# Patient Record
Sex: Male | Born: 1940 | Race: White | Hispanic: No | Marital: Married | Smoking: Former smoker
Health system: Southern US, Community
[De-identification: ages and names within clinical notes are randomized; demographics above are authoritative.]

## PROBLEM LIST (undated history)

## (undated) DIAGNOSIS — K219 Gastro-esophageal reflux disease without esophagitis: Secondary | ICD-10-CM

## (undated) DIAGNOSIS — J45909 Unspecified asthma, uncomplicated: Secondary | ICD-10-CM

## (undated) DIAGNOSIS — J449 Chronic obstructive pulmonary disease, unspecified: Secondary | ICD-10-CM

## (undated) DIAGNOSIS — J40 Bronchitis, not specified as acute or chronic: Secondary | ICD-10-CM

## (undated) DIAGNOSIS — M1990 Unspecified osteoarthritis, unspecified site: Secondary | ICD-10-CM

## (undated) DIAGNOSIS — R413 Other amnesia: Secondary | ICD-10-CM

## (undated) DIAGNOSIS — R0602 Shortness of breath: Secondary | ICD-10-CM

## (undated) DIAGNOSIS — H5440 Blindness, one eye, unspecified eye: Secondary | ICD-10-CM

## (undated) DIAGNOSIS — F32A Depression, unspecified: Secondary | ICD-10-CM

## (undated) DIAGNOSIS — F329 Major depressive disorder, single episode, unspecified: Secondary | ICD-10-CM

## (undated) DIAGNOSIS — M1711 Unilateral primary osteoarthritis, right knee: Secondary | ICD-10-CM

## (undated) DIAGNOSIS — F0390 Unspecified dementia without behavioral disturbance: Secondary | ICD-10-CM

## (undated) DIAGNOSIS — I10 Essential (primary) hypertension: Secondary | ICD-10-CM

## (undated) HISTORY — PX: EYE SURGERY: SHX253

## (undated) HISTORY — DX: Shortness of breath: R06.02

## (undated) HISTORY — DX: Gastro-esophageal reflux disease without esophagitis: K21.9

## (undated) HISTORY — DX: Unspecified osteoarthritis, unspecified site: M19.90

## (undated) HISTORY — DX: Other amnesia: R41.3

## (undated) HISTORY — DX: Chronic obstructive pulmonary disease, unspecified: J44.9

---
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
## 1978-01-19 HISTORY — PX: CHOLECYSTECTOMY: SHX55

## 2000-06-18 ENCOUNTER — Encounter: Payer: Self-pay | Admitting: Family Medicine

## 2000-06-18 ENCOUNTER — Ambulatory Visit (HOSPITAL_COMMUNITY): Admission: RE | Admit: 2000-06-18 | Discharge: 2000-06-18 | Payer: Self-pay | Admitting: Family Medicine

## 2000-12-17 ENCOUNTER — Encounter: Payer: Self-pay | Admitting: Family Medicine

## 2000-12-17 ENCOUNTER — Ambulatory Visit (HOSPITAL_COMMUNITY): Admission: RE | Admit: 2000-12-17 | Discharge: 2000-12-17 | Payer: Self-pay | Admitting: Family Medicine

## 2002-07-24 ENCOUNTER — Ambulatory Visit (HOSPITAL_COMMUNITY): Admission: RE | Admit: 2002-07-24 | Discharge: 2002-07-24 | Payer: Self-pay | Admitting: Family Medicine

## 2002-07-24 ENCOUNTER — Encounter: Payer: Self-pay | Admitting: Family Medicine

## 2004-09-26 ENCOUNTER — Ambulatory Visit: Payer: Self-pay | Admitting: Internal Medicine

## 2004-09-26 ENCOUNTER — Encounter (INDEPENDENT_AMBULATORY_CARE_PROVIDER_SITE_OTHER): Payer: Self-pay | Admitting: Internal Medicine

## 2004-09-26 ENCOUNTER — Ambulatory Visit (HOSPITAL_COMMUNITY): Admission: RE | Admit: 2004-09-26 | Discharge: 2004-09-26 | Payer: Self-pay | Admitting: Internal Medicine

## 2005-10-13 ENCOUNTER — Ambulatory Visit (HOSPITAL_COMMUNITY): Admission: RE | Admit: 2005-10-13 | Discharge: 2005-10-13 | Payer: Self-pay | Admitting: Family Medicine

## 2006-06-10 ENCOUNTER — Ambulatory Visit: Payer: Self-pay | Admitting: *Deleted

## 2007-02-16 ENCOUNTER — Ambulatory Visit (HOSPITAL_COMMUNITY): Admission: RE | Admit: 2007-02-16 | Discharge: 2007-02-16 | Payer: Self-pay | Admitting: Family Medicine

## 2009-10-14 ENCOUNTER — Encounter (INDEPENDENT_AMBULATORY_CARE_PROVIDER_SITE_OTHER): Payer: Self-pay | Admitting: *Deleted

## 2009-11-06 ENCOUNTER — Ambulatory Visit (HOSPITAL_COMMUNITY)
Admission: RE | Admit: 2009-11-06 | Discharge: 2009-11-06 | Payer: Self-pay | Source: Home / Self Care | Admitting: Internal Medicine

## 2009-11-06 ENCOUNTER — Ambulatory Visit: Payer: Self-pay | Admitting: Internal Medicine

## 2010-02-18 NOTE — Letter (Signed)
Summary: Recall, Screening Colonoscopy Only  [facility]  [address]   [place], [place] [address]   Phone: [phone]  Fax: [phone]    October 14, 2009  Willie Turner [address] [place], [place]  [address] 09-Jan-1941   Dear [name],   Our records indicate it is time to schedule your colonoscopy.    Please call our office at [phone] and ask for the nurse.   Thank you,    Hendricks Limes, LPN Cloria Spring, LPN  [facility] Ph: [phone]   Fax: [phone]

## 2010-06-06 NOTE — Op Note (Signed)
NAME:  Willie Turner, Willie Turner                ACCOUNT NO.:  [id number]   MEDICAL RECORD NO.:  [id number]          PATIENT TYPE:  AMB   LOCATION:  DAY                           FACILITY:  [facility]   PHYSICIAN:  Lionel December, M.D.    DATE OF BIRTH:  1940/09/30   DATE OF PROCEDURE:  09/26/2004  DATE OF DISCHARGE:                                 OPERATIVE REPORT   PROCEDURE:  Colonoscopy with polypectomy.   INDICATIONS:  [name] is 70 year old Caucasian male who is here for screening  colonoscopy. While family history is negative for colon carcinoma, his  brother and a sister have had polyps removed. Procedure risks were reviewed  with the patient, and informed consent was obtained.   PREMEDICATION:  Demerol 25 mg IV, Versed 4 mg IV in divided dose.   FINDINGS:  Procedure performed in endoscopy suite. The patient's vital signs  and O2 saturation were monitored during the procedure and remained stable.  The patient was placed in left lateral position and rectal examination  performed. No abnormality noted on external or digital exam. Olympus  videoscope was placed in rectum and advanced under vision into sigmoid colon  and beyond. Preparation was excellent. Scope was passed to cecum which was  identified by appendiceal orifice and ileocecal valve. Pictures taken for  the record. As the scope was withdrawn, colonic mucosa was carefully  examined. There was a 5-6 mm sessile polyp at proximal transverse colon  which was snared and retrieved for histologic examination. A second polyp  was located in the area of splenic flexure. It was pedunculated about 12 x 8  mm. This was also snared and retrieved for histologic examination. Mucosa of  the rest of the colon was normal. Rectal mucosa similarly was normal. Scope  was retroflexed to examine anorectal junction which was unremarkable.  Endoscope was straightened and withdrawn. The patient tolerated the  procedure well.   FINAL DIAGNOSIS:  1.  A 5-6 mm  sessile polyp snared from proximal transverse colon.  2.  A 12 x 8 mm pedunculated polyp snared from splenic flexure.  3.  The rest of the exam was normal.   RECOMMENDATIONS:  Standard instructions given. I will be contacting the  patient with biopsy results and further recommendations.      Lionel December, M.D.  Electronically Signed     NR/MEDQ  D:  09/26/2004  T:  09/26/2004  Job:  [id number]   cc:   Patrica Duel, M.D.  [address], [address]  [place]  [place] [address]  Fax: [phone]

## 2011-03-10 ENCOUNTER — Other Ambulatory Visit (HOSPITAL_COMMUNITY): Payer: Self-pay | Admitting: Family Medicine

## 2011-03-10 ENCOUNTER — Ambulatory Visit (HOSPITAL_COMMUNITY)
Admission: RE | Admit: 2011-03-10 | Discharge: 2011-03-10 | Disposition: A | Discharge status: Home / Self Care | Payer: Medicare Other | Source: Ambulatory Visit | Attending: Family Medicine | Admitting: Family Medicine

## 2011-03-10 DIAGNOSIS — Z6827 Body mass index (BMI) 27.0-27.9, adult: Secondary | ICD-10-CM | POA: Diagnosis not present

## 2011-03-10 DIAGNOSIS — J209 Acute bronchitis, unspecified: Secondary | ICD-10-CM | POA: Insufficient documentation

## 2011-03-10 DIAGNOSIS — J019 Acute sinusitis, unspecified: Secondary | ICD-10-CM | POA: Diagnosis not present

## 2011-07-11 DIAGNOSIS — Z6827 Body mass index (BMI) 27.0-27.9, adult: Secondary | ICD-10-CM | POA: Diagnosis not present

## 2011-07-11 DIAGNOSIS — L259 Unspecified contact dermatitis, unspecified cause: Secondary | ICD-10-CM | POA: Diagnosis not present

## 2011-07-11 DIAGNOSIS — I10 Essential (primary) hypertension: Secondary | ICD-10-CM | POA: Diagnosis not present

## 2011-07-22 DIAGNOSIS — I10 Essential (primary) hypertension: Secondary | ICD-10-CM | POA: Diagnosis not present

## 2011-07-22 DIAGNOSIS — J45909 Unspecified asthma, uncomplicated: Secondary | ICD-10-CM | POA: Diagnosis not present

## 2011-07-22 DIAGNOSIS — L259 Unspecified contact dermatitis, unspecified cause: Secondary | ICD-10-CM | POA: Diagnosis not present

## 2011-09-16 DIAGNOSIS — Z23 Encounter for immunization: Secondary | ICD-10-CM | POA: Diagnosis not present

## 2011-12-03 DIAGNOSIS — J45909 Unspecified asthma, uncomplicated: Secondary | ICD-10-CM | POA: Diagnosis not present

## 2011-12-03 DIAGNOSIS — R7301 Impaired fasting glucose: Secondary | ICD-10-CM | POA: Diagnosis not present

## 2011-12-03 DIAGNOSIS — R5381 Other malaise: Secondary | ICD-10-CM | POA: Diagnosis not present

## 2011-12-03 DIAGNOSIS — R5383 Other fatigue: Secondary | ICD-10-CM | POA: Diagnosis not present

## 2011-12-03 DIAGNOSIS — G4730 Sleep apnea, unspecified: Secondary | ICD-10-CM | POA: Diagnosis not present

## 2011-12-03 DIAGNOSIS — E785 Hyperlipidemia, unspecified: Secondary | ICD-10-CM | POA: Diagnosis not present

## 2011-12-03 DIAGNOSIS — I10 Essential (primary) hypertension: Secondary | ICD-10-CM | POA: Diagnosis not present

## 2012-01-07 DIAGNOSIS — M1710 Unilateral primary osteoarthritis, unspecified knee: Secondary | ICD-10-CM | POA: Diagnosis not present

## 2012-01-07 DIAGNOSIS — M25559 Pain in unspecified hip: Secondary | ICD-10-CM | POA: Diagnosis not present

## 2012-02-18 DIAGNOSIS — M1710 Unilateral primary osteoarthritis, unspecified knee: Secondary | ICD-10-CM | POA: Diagnosis not present

## 2012-05-24 DIAGNOSIS — M1710 Unilateral primary osteoarthritis, unspecified knee: Secondary | ICD-10-CM | POA: Diagnosis not present

## 2012-05-31 DIAGNOSIS — M1710 Unilateral primary osteoarthritis, unspecified knee: Secondary | ICD-10-CM | POA: Diagnosis not present

## 2012-06-07 DIAGNOSIS — M1710 Unilateral primary osteoarthritis, unspecified knee: Secondary | ICD-10-CM | POA: Diagnosis not present

## 2012-07-20 DIAGNOSIS — Z6828 Body mass index (BMI) 28.0-28.9, adult: Secondary | ICD-10-CM | POA: Diagnosis not present

## 2012-07-20 DIAGNOSIS — J45909 Unspecified asthma, uncomplicated: Secondary | ICD-10-CM | POA: Diagnosis not present

## 2012-07-20 DIAGNOSIS — E785 Hyperlipidemia, unspecified: Secondary | ICD-10-CM | POA: Diagnosis not present

## 2012-07-20 DIAGNOSIS — I10 Essential (primary) hypertension: Secondary | ICD-10-CM | POA: Diagnosis not present

## 2012-08-24 ENCOUNTER — Encounter (HOSPITAL_COMMUNITY): Payer: Self-pay | Admitting: Pharmacy Technician

## 2012-08-24 ENCOUNTER — Other Ambulatory Visit: Payer: Self-pay | Admitting: Physician Assistant

## 2012-08-24 ENCOUNTER — Encounter: Payer: Self-pay | Admitting: Physician Assistant

## 2012-08-24 DIAGNOSIS — M1710 Unilateral primary osteoarthritis, unspecified knee: Secondary | ICD-10-CM | POA: Diagnosis not present
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
## 2012-08-29 ENCOUNTER — Encounter (HOSPITAL_COMMUNITY): Payer: Self-pay

## 2012-08-29 ENCOUNTER — Encounter (HOSPITAL_COMMUNITY)
Admission: RE | Admit: 2012-08-29 | Discharge: 2012-08-29 | Disposition: A | Discharge status: Home / Self Care | Payer: Medicare Other | Source: Ambulatory Visit | Attending: Physician Assistant | Admitting: Physician Assistant

## 2012-08-29 ENCOUNTER — Encounter (HOSPITAL_COMMUNITY)
Admission: RE | Admit: 2012-08-29 | Discharge: 2012-08-29 | Disposition: A | Discharge status: Home / Self Care | Payer: Medicare Other | Source: Ambulatory Visit | Attending: Orthopedic Surgery | Admitting: Orthopedic Surgery

## 2012-08-29 DIAGNOSIS — Z01812 Encounter for preprocedural laboratory examination: Secondary | ICD-10-CM | POA: Diagnosis not present

## 2012-08-29 DIAGNOSIS — Z01818 Encounter for other preprocedural examination: Secondary | ICD-10-CM | POA: Insufficient documentation

## 2012-08-29 DIAGNOSIS — Z01810 Encounter for preprocedural cardiovascular examination: Secondary | ICD-10-CM | POA: Diagnosis not present

## 2012-08-29 HISTORY — DX: Bronchitis, not specified as acute or chronic: J40

## 2012-08-29 HISTORY — DX: Unspecified asthma, uncomplicated: J45.909

## 2012-08-29 HISTORY — DX: Essential (primary) hypertension: I10

## 2012-08-29 LAB — CBC WITH DIFFERENTIAL/PLATELET
Basophils Relative: 1 % (ref 0–1)
Eosinophils Absolute: 0.2 10*3/uL (ref 0.0–0.7)
Eosinophils Relative: 2 % (ref 0–5)
HCT: 50.9 % (ref 39.0–52.0)
Hemoglobin: 17.4 g/dL — ABNORMAL HIGH (ref 13.0–17.0)
MCH: 30.2 pg (ref 26.0–34.0)
MCHC: 34.2 g/dL (ref 30.0–36.0)
Monocytes Absolute: 0.7 10*3/uL (ref 0.1–1.0)
Monocytes Relative: 9 % (ref 3–12)

## 2012-08-29 LAB — COMPREHENSIVE METABOLIC PANEL
Albumin: 4.3 g/dL (ref 3.5–5.2)
BUN: 24 mg/dL — ABNORMAL HIGH (ref 6–23)
Calcium: 10.1 mg/dL (ref 8.4–10.5)
Chloride: 101 mEq/L (ref 96–112)
Creatinine, Ser: 0.91 mg/dL (ref 0.50–1.35)
Total Bilirubin: 0.7 mg/dL (ref 0.3–1.2)
Total Protein: 7.7 g/dL (ref 6.0–8.3)

## 2012-08-29 LAB — PROTIME-INR
INR: 0.99 (ref 0.00–1.49)
Prothrombin Time: 12.9 seconds (ref 11.6–15.2)

## 2012-08-29 MED ORDER — CHLORHEXIDINE GLUCONATE 4 % EX LIQD: 60.0000 mL | Freq: Once | CUTANEOUS | Status: DC

## 2012-08-29 NOTE — Pre-Procedure Instructions (Signed)
Willie Turner  08/29/2012   Your procedure is scheduled on:  Monday  September 05, 2012  Report to [facility] at 0800 AM.  Call this number if you have problems the morning of surgery: [phone]   Remember:   Do not eat food or drink liquids after midnight Sunday.   Take these medicines the morning of surgery with A SIP OF WATER: Prilosec, and Allegra if needed for allergies. Use if needed and bring inhalers Proventil and Advair  with you day of surgery   Do not wear jewelry.  Do not wear lotions, or powders. You may wear deodorant.             Men may shave face and neck.  Do not bring valuables to the hospital.  [facility] is not responsible for any belongings or valuables.  Contacts, dentures or bridgework may not be worn into surgery.  Leave suitcase in the car. After surgery it may be brought to your room.  For patients admitted to the hospital, checkout time is 11:00 AM the day of discharge.   Patients discharged the day of surgery will not be allowed to drive home.    Special Instructions: Incentive Spirometry - Practice and bring it with you on the day of surgery. Shower using CHG 2 nights before surgery and the night before surgery.  If you shower the day of surgery use CHG.  Use special wash - you have one bottle of CHG for all showers.  You should use approximately 1/3 of the bottle for each shower.   Please read over the following fact sheets that you were given: Pain Booklet, Coughing and Deep Breathing, Blood Transfusion Information, MRSA Information and Surgical Site Infection Prevention

## 2012-08-30 NOTE — Progress Notes (Signed)
Anesthesia Chart Review:  Patient is a 72 year old male scheduled for right TKR on 09/05/12 by Dr. Thurston Hole.  History includes former smoker, COPD, GERD, asthma, HTN, arthritis, cholecystectomy, ORIF of femur fracture. He saw Dr. Karleen Hampshire on 07/20/12 for preoperative clearance.  CXR on 08/29/12 showed no acute findings.  PFTs on 07/20/12 at [facility] showed an FVE 2.92 (67%), FEV1 2.26 (71%).  Moderate restriction.  Preoperative labs noted.  Patient was unable to void at his PAT appointment, so a UA will need to be done on the day of surgery.  Blood bank also called to report that he will need another T&S specimen due to antibodies.  EKG on 08/29/12 showed NSR, cannot rule out anterior infarct (age undetermined), borderline LAD.  Reverse r wave progression in V2-3 is new since his 07/20/12 EKG at [facility].  No CV symptoms were documented at his PAT visit.  He has no documented CAD/MI/CHF history and prior EKG was unremarkable just over one month ago, so could consider lead placement.  He will be evaluated on the day of surgery by his assigned anesthesiologist, but if no new CV symptoms then I would anticipate that he could proceed as planned.    Velna Ochs [facility]/Anesthesiology Phone [phone] 08/30/2012 11:44 AM

## 2012-09-02 NOTE — Progress Notes (Signed)
Notified patient of time change, instructed him to arrive 700 am 09/05/12.

## 2012-09-04 MED ORDER — CEFAZOLIN SODIUM-DEXTROSE 2-3 GM-% IV SOLR
2.0000 g | INTRAVENOUS | Status: DC
  Filled 2012-09-04: qty 50

## 2012-09-05 ENCOUNTER — Encounter (HOSPITAL_COMMUNITY): Payer: Self-pay | Admitting: Vascular Surgery

## 2012-09-05 ENCOUNTER — Inpatient Hospital Stay (HOSPITAL_COMMUNITY)
Admission: RE | Admit: 2012-09-05 | Discharge: 2012-09-06 | DRG: 470 | Disposition: A | Discharge status: Home Health | Payer: Medicare Other | Source: Ambulatory Visit | Attending: Orthopedic Surgery | Admitting: Orthopedic Surgery

## 2012-09-05 ENCOUNTER — Inpatient Hospital Stay (HOSPITAL_COMMUNITY): Payer: Medicare Other | Admitting: Anesthesiology

## 2012-09-05 ENCOUNTER — Encounter (HOSPITAL_COMMUNITY)
Admission: RE | Disposition: A | Discharge status: Home Health | Payer: Self-pay | Source: Ambulatory Visit | Attending: Orthopedic Surgery

## 2012-09-05 ENCOUNTER — Encounter (HOSPITAL_COMMUNITY): Payer: Self-pay | Admitting: Anesthesiology

## 2012-09-05 DIAGNOSIS — R0602 Shortness of breath: Secondary | ICD-10-CM | POA: Diagnosis present

## 2012-09-05 DIAGNOSIS — I10 Essential (primary) hypertension: Secondary | ICD-10-CM | POA: Diagnosis present

## 2012-09-05 DIAGNOSIS — J4489 Other specified chronic obstructive pulmonary disease: Secondary | ICD-10-CM | POA: Diagnosis present

## 2012-09-05 DIAGNOSIS — J449 Chronic obstructive pulmonary disease, unspecified: Secondary | ICD-10-CM | POA: Diagnosis present

## 2012-09-05 DIAGNOSIS — M1710 Unilateral primary osteoarthritis, unspecified knee: Secondary | ICD-10-CM | POA: Diagnosis not present

## 2012-09-05 DIAGNOSIS — Z8249 Family history of ischemic heart disease and other diseases of the circulatory system: Secondary | ICD-10-CM

## 2012-09-05 DIAGNOSIS — J40 Bronchitis, not specified as acute or chronic: Secondary | ICD-10-CM | POA: Diagnosis present

## 2012-09-05 DIAGNOSIS — M1990 Unspecified osteoarthritis, unspecified site: Secondary | ICD-10-CM | POA: Diagnosis present

## 2012-09-05 DIAGNOSIS — Z87891 Personal history of nicotine dependence: Secondary | ICD-10-CM

## 2012-09-05 DIAGNOSIS — Z79899 Other long term (current) drug therapy: Secondary | ICD-10-CM

## 2012-09-05 DIAGNOSIS — M1711 Unilateral primary osteoarthritis, right knee: Secondary | ICD-10-CM | POA: Diagnosis present

## 2012-09-05 DIAGNOSIS — J45909 Unspecified asthma, uncomplicated: Secondary | ICD-10-CM | POA: Diagnosis present

## 2012-09-05 DIAGNOSIS — Z807 Family history of other malignant neoplasms of lymphoid, hematopoietic and related tissues: Secondary | ICD-10-CM

## 2012-09-05 DIAGNOSIS — Z7982 Long term (current) use of aspirin: Secondary | ICD-10-CM

## 2012-09-05 DIAGNOSIS — G8918 Other acute postprocedural pain: Secondary | ICD-10-CM | POA: Diagnosis not present

## 2012-09-05 DIAGNOSIS — F1020 Alcohol dependence, uncomplicated: Secondary | ICD-10-CM | POA: Diagnosis present

## 2012-09-05 DIAGNOSIS — Z801 Family history of malignant neoplasm of trachea, bronchus and lung: Secondary | ICD-10-CM

## 2012-09-05 DIAGNOSIS — K219 Gastro-esophageal reflux disease without esophagitis: Secondary | ICD-10-CM | POA: Diagnosis present

## 2012-09-05 HISTORY — PX: TOTAL KNEE ARTHROPLASTY: SHX125

## 2012-09-05 HISTORY — DX: Unilateral primary osteoarthritis, right knee: M17.11

## 2012-09-05 LAB — TYPE AND SCREEN
ABO/RH(D): A POS
Antibody Screen: POSITIVE

## 2012-09-05 SURGERY — ARTHROPLASTY, KNEE, TOTAL
Anesthesia: Regional | Laterality: Right | Wound class: Clean

## 2012-09-05 MED ORDER — PHENOL 1.4 % MT LIQD: 1.0000 | OROMUCOSAL | Status: DC | PRN

## 2012-09-05 MED ORDER — ALBUTEROL SULFATE (5 MG/ML) 0.5% IN NEBU
2.5000 mg | INHALATION_SOLUTION | Freq: Four times a day (QID) | RESPIRATORY_TRACT | Status: DC
  Administered 2012-09-05: 2.5 mg via RESPIRATORY_TRACT
  Filled 2012-09-05: qty 0.5

## 2012-09-05 MED ORDER — DEXAMETHASONE SODIUM PHOSPHATE 10 MG/ML IJ SOLN
10.0000 mg | Freq: Three times a day (TID) | INTRAMUSCULAR | Status: DC
  Filled 2012-09-05 (×3): qty 1

## 2012-09-05 MED ORDER — ADULT MULTIVITAMIN W/MINERALS CH
1.0000 | ORAL_TABLET | Freq: Every day | ORAL | Status: DC
  Administered 2012-09-06: 1 via ORAL
  Filled 2012-09-05: qty 1

## 2012-09-05 MED ORDER — FENTANYL CITRATE 0.05 MG/ML IJ SOLN
INTRAMUSCULAR | Status: AC
  Administered 2012-09-05: 100 ug
  Filled 2012-09-05: qty 2

## 2012-09-05 MED ORDER — HYDROMORPHONE HCL PF 1 MG/ML IJ SOLN
INTRAMUSCULAR | Status: AC
  Filled 2012-09-05: qty 1

## 2012-09-05 MED ORDER — ONDANSETRON HCL 4 MG PO TABS: 4.0000 mg | ORAL_TABLET | Freq: Four times a day (QID) | ORAL | Status: DC | PRN

## 2012-09-05 MED ORDER — HYDROMORPHONE HCL PF 1 MG/ML IJ SOLN
0.2500 mg | INTRAMUSCULAR | Status: DC | PRN
  Administered 2012-09-05: 0.5 mg via INTRAVENOUS

## 2012-09-05 MED ORDER — LIDOCAINE HCL (CARDIAC) 20 MG/ML IV SOLN
INTRAVENOUS | Status: DC | PRN
  Administered 2012-09-05: 50 mg via INTRAVENOUS

## 2012-09-05 MED ORDER — SODIUM CHLORIDE 0.9 % IR SOLN
Status: DC | PRN
  Administered 2012-09-05: 1000 mL
  Administered 2012-09-05: 3000 mL

## 2012-09-05 MED ORDER — ZOLPIDEM TARTRATE 5 MG PO TABS: 5.0000 mg | ORAL_TABLET | Freq: Every evening | ORAL | Status: DC | PRN

## 2012-09-05 MED ORDER — POVIDONE-IODINE 7.5 % EX SOLN
Freq: Once | CUTANEOUS | Status: DC
  Filled 2012-09-05: qty 118

## 2012-09-05 MED ORDER — DEXAMETHASONE 6 MG PO TABS
10.0000 mg | ORAL_TABLET | Freq: Three times a day (TID) | ORAL | Status: DC
  Administered 2012-09-06: 10 mg via ORAL
  Filled 2012-09-05 (×3): qty 1

## 2012-09-05 MED ORDER — ACETAMINOPHEN 650 MG RE SUPP: 650.0000 mg | Freq: Four times a day (QID) | RECTAL | Status: DC | PRN

## 2012-09-05 MED ORDER — DEXAMETHASONE SODIUM PHOSPHATE 10 MG/ML IJ SOLN
10.0000 mg | Freq: Three times a day (TID) | INTRAMUSCULAR | Status: DC
  Filled 2012-09-05 (×2): qty 1

## 2012-09-05 MED ORDER — MENTHOL 3 MG MT LOZG: 1.0000 | LOZENGE | OROMUCOSAL | Status: DC | PRN

## 2012-09-05 MED ORDER — DEXAMETHASONE 4 MG PO TABS
10.0000 mg | ORAL_TABLET | Freq: Three times a day (TID) | ORAL | Status: DC
  Filled 2012-09-05 (×2): qty 2

## 2012-09-05 MED ORDER — DEXAMETHASONE SODIUM PHOSPHATE 10 MG/ML IJ SOLN: 10.0000 mg | Freq: Three times a day (TID) | INTRAMUSCULAR | Status: DC

## 2012-09-05 MED ORDER — PANTOPRAZOLE SODIUM 40 MG PO TBEC
40.0000 mg | DELAYED_RELEASE_TABLET | Freq: Every day | ORAL | Status: DC
  Administered 2012-09-06: 40 mg via ORAL
  Filled 2012-09-05: qty 1

## 2012-09-05 MED ORDER — DEXAMETHASONE SODIUM PHOSPHATE 10 MG/ML IJ SOLN
10.0000 mg | Freq: Four times a day (QID) | INTRAMUSCULAR | Status: DC
  Administered 2012-09-05: 10 mg via INTRAVENOUS
  Filled 2012-09-05 (×3): qty 1

## 2012-09-05 MED ORDER — DIPHENHYDRAMINE HCL 12.5 MG/5ML PO ELIX: 12.5000 mg | ORAL_SOLUTION | ORAL | Status: DC | PRN

## 2012-09-05 MED ORDER — BUPIVACAINE-EPINEPHRINE PF 0.5-1:200000 % IJ SOLN
INTRAMUSCULAR | Status: DC | PRN
  Administered 2012-09-05: 30 mL

## 2012-09-05 MED ORDER — DOCUSATE SODIUM 100 MG PO CAPS
100.0000 mg | ORAL_CAPSULE | Freq: Two times a day (BID) | ORAL | Status: DC
  Administered 2012-09-05 – 2012-09-06 (×2): 100 mg via ORAL
  Filled 2012-09-05 (×2): qty 1

## 2012-09-05 MED ORDER — LORATADINE 10 MG PO TABS
10.0000 mg | ORAL_TABLET | Freq: Every day | ORAL | Status: DC
  Administered 2012-09-06: 10 mg via ORAL
  Filled 2012-09-05: qty 1

## 2012-09-05 MED ORDER — POTASSIUM CHLORIDE IN NACL 20-0.9 MEQ/L-% IV SOLN
INTRAVENOUS | Status: DC
  Administered 2012-09-05: 21:00:00 via INTRAVENOUS
  Filled 2012-09-05 (×6): qty 1000

## 2012-09-05 MED ORDER — CELECOXIB 200 MG PO CAPS
200.0000 mg | ORAL_CAPSULE | Freq: Two times a day (BID) | ORAL | Status: DC
  Administered 2012-09-05 – 2012-09-06 (×3): 200 mg via ORAL
  Filled 2012-09-05 (×5): qty 1

## 2012-09-05 MED ORDER — METOCLOPRAMIDE HCL 10 MG PO TABS: 5.0000 mg | ORAL_TABLET | Freq: Three times a day (TID) | ORAL | Status: DC | PRN

## 2012-09-05 MED ORDER — OXYCODONE HCL 5 MG PO TABS
5.0000 mg | ORAL_TABLET | ORAL | Status: DC | PRN
  Administered 2012-09-05 – 2012-09-06 (×2): 10 mg via ORAL
  Filled 2012-09-05 (×2): qty 2

## 2012-09-05 MED ORDER — BUPIVACAINE-EPINEPHRINE PF 0.25-1:200000 % IJ SOLN
INTRAMUSCULAR | Status: AC
  Filled 2012-09-05: qty 30

## 2012-09-05 MED ORDER — CEFAZOLIN SODIUM-DEXTROSE 2-3 GM-% IV SOLR
2.0000 g | Freq: Four times a day (QID) | INTRAVENOUS | Status: AC
  Administered 2012-09-05 (×2): 2 g via INTRAVENOUS
  Filled 2012-09-05 (×2): qty 50

## 2012-09-05 MED ORDER — LACTATED RINGERS IV SOLN: INTRAVENOUS | Status: DC

## 2012-09-05 MED ORDER — ONDANSETRON HCL 4 MG/2ML IJ SOLN
4.0000 mg | Freq: Four times a day (QID) | INTRAMUSCULAR | Status: DC | PRN
  Administered 2012-09-05: 4 mg via INTRAVENOUS
  Filled 2012-09-05: qty 2

## 2012-09-05 MED ORDER — BISACODYL 5 MG PO TBEC
10.0000 mg | DELAYED_RELEASE_TABLET | Freq: Every day | ORAL | Status: DC
  Administered 2012-09-05: 10 mg via ORAL
  Filled 2012-09-05: qty 2

## 2012-09-05 MED ORDER — DIPHENHYDRAMINE HCL 50 MG/ML IJ SOLN
INTRAMUSCULAR | Status: DC | PRN
  Administered 2012-09-05: 25 mg via INTRAVENOUS

## 2012-09-05 MED ORDER — DEXAMETHASONE 6 MG PO TABS: 10.0000 mg | ORAL_TABLET | Freq: Three times a day (TID) | ORAL | Status: DC

## 2012-09-05 MED ORDER — CEFAZOLIN SODIUM-DEXTROSE 2-3 GM-% IV SOLR
INTRAVENOUS | Status: DC | PRN
  Administered 2012-09-05: 2 g via INTRAVENOUS

## 2012-09-05 MED ORDER — DEXAMETHASONE SODIUM PHOSPHATE 10 MG/ML IJ SOLN
10.0000 mg | Freq: Three times a day (TID) | INTRAMUSCULAR | Status: DC
  Administered 2012-09-05: 10 mg via INTRAVENOUS
  Filled 2012-09-05 (×3): qty 1

## 2012-09-05 MED ORDER — DEXAMETHASONE 6 MG PO TABS
10.0000 mg | ORAL_TABLET | Freq: Three times a day (TID) | ORAL | Status: DC
  Filled 2012-09-05 (×4): qty 1

## 2012-09-05 MED ORDER — CHLORHEXIDINE GLUCONATE 4 % EX LIQD: 60.0000 mL | Freq: Once | CUTANEOUS | Status: DC

## 2012-09-05 MED ORDER — OXYCODONE HCL 5 MG/5ML PO SOLN: 5.0000 mg | Freq: Once | ORAL | Status: DC | PRN

## 2012-09-05 MED ORDER — HYDROMORPHONE HCL PF 1 MG/ML IJ SOLN
0.5000 mg | INTRAMUSCULAR | Status: DC | PRN
  Administered 2012-09-05: 1 mg via INTRAVENOUS
  Filled 2012-09-05: qty 1

## 2012-09-05 MED ORDER — ALUM & MAG HYDROXIDE-SIMETH 200-200-20 MG/5ML PO SUSP: 30.0000 mL | ORAL | Status: DC | PRN

## 2012-09-05 MED ORDER — DEXAMETHASONE 6 MG PO TABS
10.0000 mg | ORAL_TABLET | Freq: Four times a day (QID) | ORAL | Status: DC
  Filled 2012-09-05 (×3): qty 1

## 2012-09-05 MED ORDER — TRANEXAMIC ACID 100 MG/ML IV SOLN
1000.0000 mg | INTRAVENOUS | Status: AC
  Administered 2012-09-05: 1000 mg via INTRAVENOUS
  Filled 2012-09-05: qty 10

## 2012-09-05 MED ORDER — ACETAMINOPHEN 325 MG PO TABS: 650.0000 mg | ORAL_TABLET | Freq: Four times a day (QID) | ORAL | Status: DC | PRN

## 2012-09-05 MED ORDER — ALBUTEROL SULFATE (5 MG/ML) 0.5% IN NEBU
2.5000 mg | INHALATION_SOLUTION | Freq: Two times a day (BID) | RESPIRATORY_TRACT | Status: DC
  Administered 2012-09-05 – 2012-09-06 (×2): 2.5 mg via RESPIRATORY_TRACT
  Filled 2012-09-05 (×2): qty 0.5

## 2012-09-05 MED ORDER — FENTANYL CITRATE 0.05 MG/ML IJ SOLN
INTRAMUSCULAR | Status: DC | PRN
  Administered 2012-09-05 (×5): 50 ug via INTRAVENOUS

## 2012-09-05 MED ORDER — OXYCODONE HCL 5 MG PO TABS: 5.0000 mg | ORAL_TABLET | Freq: Once | ORAL | Status: DC | PRN

## 2012-09-05 MED ORDER — PROPOFOL INFUSION 10 MG/ML OPTIME
INTRAVENOUS | Status: DC | PRN
  Administered 2012-09-05: 50 ug/kg/min via INTRAVENOUS

## 2012-09-05 MED ORDER — MOMETASONE FURO-FORMOTEROL FUM 100-5 MCG/ACT IN AERO
2.0000 | INHALATION_SPRAY | Freq: Two times a day (BID) | RESPIRATORY_TRACT | Status: DC
  Administered 2012-09-05 – 2012-09-06 (×2): 2 via RESPIRATORY_TRACT
  Filled 2012-09-05: qty 8.8

## 2012-09-05 MED ORDER — MIDAZOLAM HCL 2 MG/2ML IJ SOLN
INTRAMUSCULAR | Status: AC
  Administered 2012-09-05: 1 mg
  Filled 2012-09-05: qty 2

## 2012-09-05 MED ORDER — LACTATED RINGERS IV SOLN
INTRAVENOUS | Status: DC
  Administered 2012-09-05: 08:00:00 via INTRAVENOUS

## 2012-09-05 MED ORDER — LACTATED RINGERS IV SOLN
INTRAVENOUS | Status: DC | PRN
  Administered 2012-09-05: 09:00:00 via INTRAVENOUS

## 2012-09-05 MED ORDER — METOCLOPRAMIDE HCL 5 MG/ML IJ SOLN: 5.0000 mg | Freq: Three times a day (TID) | INTRAMUSCULAR | Status: DC | PRN

## 2012-09-05 MED ORDER — DEXAMETHASONE 4 MG PO TABS: 10.0000 mg | ORAL_TABLET | Freq: Three times a day (TID) | ORAL | Status: DC

## 2012-09-05 MED ORDER — ASPIRIN EC 325 MG PO TBEC
325.0000 mg | DELAYED_RELEASE_TABLET | Freq: Two times a day (BID) | ORAL | Status: DC
  Administered 2012-09-05 – 2012-09-06 (×2): 325 mg via ORAL
  Filled 2012-09-05 (×5): qty 1

## 2012-09-05 MED ORDER — BUPIVACAINE-EPINEPHRINE 0.25% -1:200000 IJ SOLN
INTRAMUSCULAR | Status: DC | PRN
  Administered 2012-09-05: 30 mL

## 2012-09-05 SURGICAL SUPPLY — 73 items
BANDAGE ELASTIC 6 VELCRO ST LF (GAUZE/BANDAGES/DRESSINGS) ×2 IMPLANT
BANDAGE ESMARK 6X9 LF (GAUZE/BANDAGES/DRESSINGS) ×1 IMPLANT
BLADE SAGITTAL 25.0X1.19X90 (BLADE) ×2 IMPLANT
BLADE SAW SGTL 11.0X1.19X90.0M (BLADE) IMPLANT
BLADE SAW SGTL 13.0X1.19X90.0M (BLADE) ×2 IMPLANT
BLADE SURG 10 STRL SS (BLADE) ×4 IMPLANT
BNDG ELASTIC 6X15 VLCR STRL LF (GAUZE/BANDAGES/DRESSINGS) ×2 IMPLANT
BNDG ESMARK 6X9 LF (GAUZE/BANDAGES/DRESSINGS) ×2
BOWL SMART MIX CTS (DISPOSABLE) ×2 IMPLANT
CAPT RP KNEE ×2 IMPLANT
CEMENT HV SMART SET (Cement) ×4 IMPLANT
CLOTH BEACON ORANGE TIMEOUT ST (SAFETY) ×2 IMPLANT
COVER SURGICAL LIGHT HANDLE (MISCELLANEOUS) ×2 IMPLANT
CUFF TOURNIQUET SINGLE 34IN LL (TOURNIQUET CUFF) ×2 IMPLANT
CUFF TOURNIQUET SINGLE 44IN (TOURNIQUET CUFF) IMPLANT
DRAPE EXTREMITY T 121X128X90 (DRAPE) ×2 IMPLANT
DRAPE INCISE IOBAN 66X45 STRL (DRAPES) ×2 IMPLANT
DRAPE PROXIMA HALF (DRAPES) ×4 IMPLANT
DRAPE U-SHAPE 47X51 STRL (DRAPES) ×2 IMPLANT
DRSG ADAPTIC 3X8 NADH LF (GAUZE/BANDAGES/DRESSINGS) ×2 IMPLANT
DRSG PAD ABDOMINAL 8X10 ST (GAUZE/BANDAGES/DRESSINGS) ×4 IMPLANT
DURAPREP 26ML APPLICATOR (WOUND CARE) ×2 IMPLANT
ELECT CAUTERY BLADE 6.4 (BLADE) ×2 IMPLANT
ELECT REM PT RETURN 9FT ADLT (ELECTROSURGICAL) ×2
ELECTRODE REM PT RTRN 9FT ADLT (ELECTROSURGICAL) ×1 IMPLANT
EVACUATOR 1/8 PVC DRAIN (DRAIN) IMPLANT
FACESHIELD LNG OPTICON STERILE (SAFETY) ×4 IMPLANT
GLOVE BIO SURGEON STRL SZ7 (GLOVE) ×6 IMPLANT
GLOVE BIOGEL PI IND STRL 7.0 (GLOVE) ×2 IMPLANT
GLOVE BIOGEL PI IND STRL 7.5 (GLOVE) ×1 IMPLANT
GLOVE BIOGEL PI IND STRL 8 (GLOVE) ×1 IMPLANT
GLOVE BIOGEL PI INDICATOR 7.0 (GLOVE) ×2
GLOVE BIOGEL PI INDICATOR 7.5 (GLOVE) ×1
GLOVE BIOGEL PI INDICATOR 8 (GLOVE) ×1
GLOVE SS BIOGEL STRL SZ 7.5 (GLOVE) ×1 IMPLANT
GLOVE SUPERSENSE BIOGEL SZ 7.5 (GLOVE) ×1
GLOVE SURG SS PI 8.0 STRL IVOR (GLOVE) ×2 IMPLANT
GOWN PREVENTION PLUS XLARGE (GOWN DISPOSABLE) ×6 IMPLANT
GOWN STRL NON-REIN LRG LVL3 (GOWN DISPOSABLE) ×4 IMPLANT
HANDPIECE INTERPULSE COAX TIP (DISPOSABLE) ×1
HOOD PEEL AWAY FACE SHEILD DIS (HOOD) ×4 IMPLANT
IMMOBILIZER KNEE 22 UNIV (SOFTGOODS) ×2 IMPLANT
KIT BASIN OR (CUSTOM PROCEDURE TRAY) ×2 IMPLANT
KIT ROOM TURNOVER OR (KITS) ×2 IMPLANT
MANIFOLD NEPTUNE II (INSTRUMENTS) ×2 IMPLANT
NS IRRIG 1000ML POUR BTL (IV SOLUTION) ×2 IMPLANT
PACK TOTAL JOINT (CUSTOM PROCEDURE TRAY) ×2 IMPLANT
PAD ARMBOARD 7.5X6 YLW CONV (MISCELLANEOUS) ×4 IMPLANT
PAD CAST 4YDX4 CTTN HI CHSV (CAST SUPPLIES) ×1 IMPLANT
PADDING CAST ABS 4INX4YD NS (CAST SUPPLIES) ×1
PADDING CAST ABS 6INX4YD NS (CAST SUPPLIES) ×1
PADDING CAST ABS COTTON 4X4 ST (CAST SUPPLIES) ×1 IMPLANT
PADDING CAST ABS COTTON 6X4 NS (CAST SUPPLIES) ×1 IMPLANT
PADDING CAST COTTON 4X4 STRL (CAST SUPPLIES) ×1
PADDING CAST COTTON 6X4 STRL (CAST SUPPLIES) ×2 IMPLANT
POSITIONER HEAD PRONE TRACH (MISCELLANEOUS) ×2 IMPLANT
RUBBERBAND STERILE (MISCELLANEOUS) ×2 IMPLANT
SET HNDPC FAN SPRY TIP SCT (DISPOSABLE) ×1 IMPLANT
SPONGE GAUZE 4X4 12PLY (GAUZE/BANDAGES/DRESSINGS) ×2 IMPLANT
STRIP CLOSURE SKIN 1/2X4 (GAUZE/BANDAGES/DRESSINGS) ×2 IMPLANT
SUCTION FRAZIER TIP 10 FR DISP (SUCTIONS) ×2 IMPLANT
SUT ETHIBOND NAB CT1 #1 30IN (SUTURE) ×4 IMPLANT
SUT MNCRL AB 3-0 PS2 18 (SUTURE) ×2 IMPLANT
SUT VIC AB 0 CT1 27 (SUTURE) ×2
SUT VIC AB 0 CT1 27XBRD ANBCTR (SUTURE) ×2 IMPLANT
SUT VIC AB 2-0 CT1 27 (SUTURE) ×2
SUT VIC AB 2-0 CT1 TAPERPNT 27 (SUTURE) ×2 IMPLANT
SYR 30ML SLIP (SYRINGE) ×2 IMPLANT
TOWEL OR 17X24 6PK STRL BLUE (TOWEL DISPOSABLE) ×2 IMPLANT
TOWEL OR 17X26 10 PK STRL BLUE (TOWEL DISPOSABLE) ×2 IMPLANT
TRAY FOLEY CATH 16FR SILVER (SET/KITS/TRAYS/PACK) ×2 IMPLANT
TRAY FOLEY CATH 16FRSI W/METER (SET/KITS/TRAYS/PACK) IMPLANT
WATER STERILE IRR 1000ML POUR (IV SOLUTION) ×4 IMPLANT

## 2012-09-05 NOTE — Interval H&P Note (Signed)
History and Physical Interval Note:  09/05/2012 8:53 AM  Willie Turner  has presented today for surgery, with the diagnosis of DJD RIGHT KNEE  The various methods of treatment have been discussed with the patient and family. After consideration of risks, benefits and other options for treatment, the patient has consented to  Procedure(s): TOTAL KNEE ARTHROPLASTY (Right) as a surgical intervention .  The patient's history has been reviewed, patient examined, no change in status, stable for surgery.  I have reviewed the patient's chart and labs.  Questions were answered to the patient's satisfaction.     Salvatore Marvel A

## 2012-09-05 NOTE — Op Note (Signed)
MRN:     [id number] DOB/AGE:    10-04-1940 / 72 y.o.       OPERATIVE REPORT    DATE OF PROCEDURE:  09/05/2012       PREOPERATIVE DIAGNOSIS:   DJD RIGHT KNEE      There is no weight on file to calculate BMI.                                                        POSTOPERATIVE DIAGNOSIS:   DJD RIGHT KNEE                                                                      PROCEDURE:  Procedure(s): TOTAL KNEE ARTHROPLASTY Using Depuy Sigma RP implants #4 Femur, #4Tibia, 12.69mm sigma RP bearing, 35 Patella     SURGEON: Yaffa Seckman A    ASSISTANT:  Kirstin Shepperson PA-C   (Present and scrubbed throughout the case, critical for assistance with exposure, retraction, instrumentation, and closure.)         ANESTHESIA: Spinal with Femoral Nerve Block  DRAINS: foley, 2 medium hemovac in knee   TOURNIQUET TIME:   COMPLICATIONS:  None     SPECIMENS: None   INDICATIONS FOR PROCEDURE: The patient has  DJD RIGHT KNEE, varus deformities, XR shows bone on bone arthritis. Patient has failed all conservative measures including anti-inflammatory medicines, narcotics, attempts at  exercise and weight loss, cortisone injections and viscosupplementation.  Risks and benefits of surgery have been discussed, questions answered.   DESCRIPTION OF PROCEDURE: The patient identified by armband, received  right femoral nerve block and IV antibiotics, in the holding area at [facility]. Patient taken to the operating room, appropriate anesthetic  monitors were attached General endotracheal anesthesia induced with  the patient in supine position, Foley catheter was inserted. Tourniquet  applied high to the operative thigh. Lateral post and foot positioner  applied to the table, the lower extremity was then prepped and draped  in usual sterile fashion from the ankle to the tourniquet. Time-out procedure was performed. The limb was wrapped with an Esmarch bandage and the tourniquet inflated to 365  mmHg. We began the operation by making the anterior midline incision starting at handbreadth above the patella going over the patella 1 cm medial to and  4 cm distal to the tibial tubercle. Small bleeders in the skin and the  subcutaneous tissue identified and cauterized. Transverse retinaculum was incised and reflected medially and a medial parapatellar arthrotomy was accomplished. the patella was everted and theprepatellar fat pad resected. The superficial medial collateral  ligament was then elevated from anterior to posterior along the proximal  flare of the tibia and anterior half of the menisci resected. The knee was hyperflexed exposing bone on bone arthritis. Peripheral and notch osteophytes as well as the cruciate ligaments were then resected. We continued to  work our way around posteriorly along the proximal tibia, and externally  rotated the tibia subluxing it out from underneath the femur. A McHale  retractor was placed through the notch and a lateral Personal assistant  placed, and we then drilled through the proximal tibia in line with the  axis of the tibia followed by an intramedullary guide rod and 2-degree  posterior slope cutting guide. The tibial cutting guide was pinned into place  allowing resection of 4 mm of bone medially and about 6 mm of bone  laterally because of her varus deformity. Satisfied with the tibial resection, we then  entered the distal femur 2 mm anterior to the PCL origin with the  intramedullary guide rod and applied the distal femoral cutting guide  set at 11mm, with 5 degrees of valgus. This was pinned along the  epicondylar axis. At this point, the distal femoral cut was accomplished without difficulty. We then sized for a #4 femoral component and pinned the guide in 3 degrees of external rotation.The chamfer cutting guide was pinned into place. The anterior, posterior, and chamfer cuts were accomplished without difficulty followed by  the Sigma RP box  cutting guide and the box cut. We also removed posterior osteophytes from the posterior femoral condyles. At this  time, the knee was brought into full extension. We checked our  extension and flexion gaps and found them symmetric at 12.53mm.  The patella thickness measured at 23 mm. We set the cutting guide at 14 and removed the posterior 9.5-10 mm  of the patella sized for 35 button and drilled the lollipop. The knee  was then once again hyperflexed exposing the proximal tibia. We sized for a #4 tibial base plate, applied the smokestack and the conical reamer followed by the the Delta fin keel punch. We then hammered into place the Sigma RP trial femoral component, inserted a 12.5-mm trial bearing, trial patellar button, and took the knee through range of motion from 0-130 degrees. No thumb pressure was required for patellar  tracking. At this point, all trial components were removed, a double batch of DePuy HV cement  was mixed and applied to all bony metallic mating surfaces except for the posterior condyles of the femur itself. In order, we  hammered into place the tibial tray and removed excess cement, the femoral component and removed excess cement, a 12.5-mm Sigma RP bearing  was inserted, and the knee brought to full extension with compression.  The patellar button was clamped into place, and excess cement  removed. While the cement cured the wound was irrigated out with normal saline solution pulse lavage, and medium Hemovac drains were placed.. Ligament stability and patellar tracking were checked and found to be excellent. The tourniquet was then released and hemostasis was obtained with cautery. The parapatellar arthrotomy was closed with  #1 ethibond suture. The subcutaneous tissue with 0 and 2-0 undyed  Vicryl suture, and 4-0 Monocryl.. A dressing of Xeroform,  4 x 4, dressing sponges, Webril, and Ace wrap applied. Needle and sponge count were correct times 2.The patient awakened,  extubated, and taken to recovery room without difficulty. Vascular status was normal, pulses 2+ and symmetric.   Caydance Kuehnle A 09/05/2012, 10:44 AM

## 2012-09-05 NOTE — Anesthesia Postprocedure Evaluation (Signed)
  Anesthesia Post-op Note  Patient: Willie Turner  Procedure(s) Performed: Procedure(s): TOTAL KNEE ARTHROPLASTY (Right)  Patient Location: PACU  Anesthesia Type:Spinal  Level of Consciousness: awake, alert  and oriented  Airway and Oxygen Therapy: Patient Spontanous Breathing  Post-op Pain: mild  Post-op Assessment: Post-op Vital signs reviewed, Patient's Cardiovascular Status Stable, Respiratory Function Stable, Patent Airway, No signs of Nausea or vomiting and No residual motor weakness  Post-op Vital Signs: Reviewed and stable  Complications: No apparent anesthesia complications

## 2012-09-05 NOTE — Progress Notes (Signed)
UR COMPLETED  

## 2012-09-05 NOTE — Anesthesia Preprocedure Evaluation (Signed)
Anesthesia Evaluation  Patient identified by MRN, date of birth, ID band Patient awake    Reviewed: Allergy & Precautions, H&P , NPO status , Patient's Chart, lab work & pertinent test results  Airway Mallampati: II TM Distance: >3 FB Neck ROM: Full    Dental no notable dental hx. (+) Teeth Intact and Dental Advisory Given   Pulmonary asthma , COPD COPD inhaler,  breath sounds clear to auscultation  Pulmonary exam normal       Cardiovascular hypertension, On Medications Rhythm:Regular Rate:Normal     Neuro/Psych negative neurological ROS  negative psych ROS   GI/Hepatic Neg liver ROS, GERD-  Medicated and Controlled,  Endo/Other  negative endocrine ROS  Renal/GU negative Renal ROS  negative genitourinary   Musculoskeletal   Abdominal   Peds  Hematology negative hematology ROS (+)   Anesthesia Other Findings   Reproductive/Obstetrics negative OB ROS                           Anesthesia Physical Anesthesia Plan  ASA: II  Anesthesia Plan: Spinal and Regional   Post-op Pain Management:    Induction: Intravenous  Airway Management Planned: Simple Face Mask  Additional Equipment:   Intra-op Plan:   Post-operative Plan:   Informed Consent: I have reviewed the patients History and Physical, chart, labs and discussed the procedure including the risks, benefits and alternatives for the proposed anesthesia with the patient or authorized representative who has indicated his/her understanding and acceptance.   Dental advisory given  Plan Discussed with: CRNA  Anesthesia Plan Comments:         Anesthesia Quick Evaluation

## 2012-09-05 NOTE — Progress Notes (Signed)
Orthopedic Tech Progress Note Patient Details:  Barrie Wale [facility] September 18, 1940 [id number]  CPM Right Knee CPM Right Knee: On Right Knee Flexion (Degrees): 60 Right Knee Extension (Degrees): 0 Additional Comments: trapeze bar and foot roll   Cammer, Mickie Bail 09/05/2012, 4:30 PM

## 2012-09-05 NOTE — Evaluation (Signed)
Physical Therapy Evaluation Patient Details Name: Willie Turner MRN: [id number] DOB: [birth year] Today's Date: 09/05/2012 Time: 4098-1191 PT Time Calculation (min): 19 min  PT Assessment / Plan / Recommendation History of Present Illness  s/p elective Rt TKA   Clinical Impression  Pt is s/p Rt TKA POD#0 resulting in the deficits listed below (see PT Problem List). Pt will benefit from skilled PT to increase their independence and safety with mobility to allow discharge to the venue listed below. Pt highly motivated to return home with wife and walk without pain.     PT Assessment  Patient needs continued PT services    Follow Up Recommendations  Home health PT;Supervision/Assistance - 24 hour    Does the patient have the potential to tolerate intense rehabilitation      Barriers to Discharge   none    Equipment Recommendations  None recommended by PT    Recommendations for Other Services OT consult   Frequency 7X/week    Precautions / Restrictions Precautions Precautions: Fall;Knee Required Braces or Orthoses: Knee Immobilizer - Right Knee Immobilizer - Right: Discontinue once straight leg raise with < 10 degree lag Restrictions Weight Bearing Restrictions: Yes RLE Weight Bearing: Weight bearing as tolerated   Pertinent Vitals/Pain 6/10; RN notified       Mobility  Bed Mobility Bed Mobility: Supine to Sit;Sitting - Scoot to Edge of Bed Supine to Sit: 4: Min guard;HOB elevated;With rails Sitting - Scoot to Edge of Bed: 5: Supervision;With rail Details for Bed Mobility Assistance: cues for hand placement and sequencing; min guard to control descent of Rt LE to floor; requires increased time due to pain  Transfers Transfers: Sit to Stand;Stand to Sit Sit to Stand: 3: Mod assist;From bed;From elevated surface Stand to Sit: 4: Min assist;To chair/3-in-1;With armrests;With upper extremity assist Details for Transfer Assistance: pt required mod (A) to achieve upright  standing position and maintain balance; cues for hand placement and safety; cues to shift weight anteriorly and WB through Lt LE due to pain with WB through Rt LE Ambulation/Gait Ambulation/Gait Assistance: 4: Min assist Ambulation Distance (Feet): 6 Feet Assistive device: Rolling walker Ambulation/Gait Assistance Details: cues for gt sequencing and RW safety; min (A) to maintain balance and manage RW; Rt LE buckling with amb  Gait Pattern: Decreased stance time - right;Decreased step length - left;Narrow base of support;Trunk flexed Gait velocity: decreased; Rt LE buckling Stairs: No Wheelchair Mobility Wheelchair Mobility: No    Exercises Total Joint Exercises Ankle Circles/Pumps: AROM;10 reps;Supine Quad Sets: AAROM;Right;10 reps;Seated;Other (comment) (while in footsie roll)   PT Diagnosis: Difficulty walking;Acute pain  PT Problem List: Decreased strength;Decreased range of motion;Decreased balance;Decreased mobility;Decreased knowledge of use of DME;Pain PT Treatment Interventions: DME instruction;Gait training;Stair training;Functional mobility training;Therapeutic activities;Balance training;Therapeutic exercise;Neuromuscular re-education;Patient/family education     PT Goals(Current goals can be found in the care plan section) Acute Rehab PT Goals Patient Stated Goal: to walk without pain PT Goal Formulation: With patient Time For Goal Achievement: 09/12/12 Potential to Achieve Goals: Good  Visit Information  Last PT Received On: 09/05/12 Assistance Needed: +1 History of Present Illness: s/p elective Rt TKA        Prior Functioning  Home Living Family/patient expects to be discharged to:: Private residence Living Arrangements: Spouse/significant other Available Help at Discharge: Family;Available 24 hours/day Type of Home: House Home Access: Stairs to enter [employer] of Steps: 2 Entrance Stairs-Rails: Can reach both Home Layout: Two level Home  Equipment: Walker - 2 wheels;Bedside commode;Shower seat;Grab  bars - tub/shower Additional Comments: has chair lift to get second floor of house; has walk in shower and has seat   Prior Function Level of Independence: Independent Communication Communication: No difficulties    Cognition  Cognition Arousal/Alertness: Awake/alert Behavior During Therapy: WFL for tasks assessed/performed Overall Cognitive Status: Within Functional Limits for tasks assessed    Extremity/Trunk Assessment Upper Extremity Assessment Upper Extremity Assessment: Defer to OT evaluation Lower Extremity Assessment Lower Extremity Assessment: RLE deficits/detail RLE Deficits / Details: knee grossly 2/5; limited by pain  RLE: Unable to fully assess due to pain RLE Sensation: decreased light touch Cervical / Trunk Assessment Cervical / Trunk Assessment: Normal   Balance Balance Balance Assessed: Yes Static Sitting Balance Static Sitting - Balance Support: Feet supported;Left upper extremity supported Static Sitting - Level of Assistance: 5: Stand by assistance  End of Session PT - End of Session Equipment Utilized During Treatment: Gait belt;Right knee immobilizer Activity Tolerance: Patient tolerated treatment well Patient left: in chair;with call bell/phone within reach;with nursing/sitter in room Nurse Communication: Mobility status;Patient requests pain meds CPM Right Knee CPM Right Knee: Off Right Knee Flexion (Degrees): 60 Right Knee Extension (Degrees): 0  GP     Shelva Majestic [place], [place] [phone] 09/05/2012, 2:47 PM

## 2012-09-05 NOTE — Anesthesia Procedure Notes (Addendum)
Anesthesia Regional Block:  Femoral nerve block  Pre-Anesthetic Checklist: ,, timeout performed, Correct Patient, Correct Site, Correct Laterality, Correct Procedure, Correct Position, site marked, Risks and benefits discussed, pre-op evaluation,  At surgeon's request and post-op pain management  Laterality: Right  Prep: Maximum Sterile Barrier Precautions used and chloraprep       Needles:  Injection technique: Single-shot  Needle Type: Echogenic Stimulator Needle     Needle Length: 5cm 5 cm Needle Gauge: 22 and 22 G    Additional Needles:  Procedures: ultrasound guided (picture in chart) Femoral nerve block  Nerve Stimulator or Paresthesia:  Response: Patellar respose,   Additional Responses:   Narrative:  Start time: 09/05/2012 8:28 AM End time: 09/05/2012 8:40 AM Injection made incrementally with aspirations every 5 mL. Anesthesiologist: Armstead Heiland,MD  Additional Notes: 2% Lidocaine skin wheel.   Femoral nerve block Spinal  Patient location during procedure: OR Start time: 09/05/2012 9:10 AM End time: 09/05/2012 9:16 AM Staffing Anesthesiologist: Gaynelle Adu E Performed by: anesthesiologist  Preanesthetic Checklist Completed: patient identified, surgical consent, pre-op evaluation, timeout performed, IV checked, risks and benefits discussed and monitors and equipment checked Spinal Block Patient position: sitting Prep: Betadine Patient monitoring: cardiac monitor, continuous pulse ox and blood pressure Approach: midline Location: L3-4 Injection technique: single-shot Needle Needle type: Quincke  Needle gauge: 25 G Needle length: 9 cm Assessment Sensory level: T10 Additional Notes Functioning IV was confirmed and monitors were applied. Sterile prep and drape, including hand hygiene and sterile gloves were used. The patient was positioned and the spine was prepped. The skin was anesthetized with lidocaine.  Free flow of clear CSF was obtained prior  to injecting local anesthetic into the CSF.  The spinal needle aspirated freely following injection.  The needle was carefully withdrawn.  The patient tolerated the procedure well.

## 2012-09-05 NOTE — Transfer of Care (Signed)
Immediate Anesthesia Transfer of Care Note  Patient: Willie Turner  Procedure(s) Performed: Procedure(s): TOTAL KNEE ARTHROPLASTY (Right)  Patient Location: PACU  Anesthesia Type:Regional and Spinal  Level of Consciousness: awake, alert , oriented and patient cooperative  Airway & Oxygen Therapy: Patient Spontanous Breathing  Post-op Assessment: Report given to PACU RN, Post -op Vital signs reviewed and stable and Patient moving all extremities  Post vital signs: Reviewed and stable  Complications: No apparent anesthesia complications
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
## 2012-09-06 ENCOUNTER — Encounter (HOSPITAL_COMMUNITY): Payer: Self-pay | Admitting: Orthopedic Surgery

## 2012-09-06 DIAGNOSIS — F1020 Alcohol dependence, uncomplicated: Secondary | ICD-10-CM | POA: Diagnosis present

## 2012-09-06 LAB — BASIC METABOLIC PANEL
BUN: 15 mg/dL (ref 6–23)
CO2: 22 mEq/L (ref 19–32)
GFR calc non Af Amer: 88 mL/min — ABNORMAL LOW (ref >90)
Glucose, Bld: 175 mg/dL — ABNORMAL HIGH (ref 70–99)
Potassium: 4 mEq/L (ref 3.5–5.1)
Sodium: 135 mEq/L (ref 135–145)

## 2012-09-06 LAB — URINE CULTURE
Colony Count: NO GROWTH
Culture: NO GROWTH

## 2012-09-06 LAB — CBC
HCT: 40.7 % (ref 39.0–52.0)
Hemoglobin: 14.2 g/dL (ref 13.0–17.0)
MCH: 30.6 pg (ref 26.0–34.0)
MCHC: 34.9 g/dL (ref 30.0–36.0)
RBC: 4.64 MIL/uL (ref 4.22–5.81)

## 2012-09-06 LAB — TYPE AND SCREEN
Donor AG Type: NEGATIVE
Donor AG Type: NEGATIVE

## 2012-09-06 MED ORDER — DSS 100 MG PO CAPS: ORAL_CAPSULE | ORAL | Status: DC

## 2012-09-06 MED ORDER — CELECOXIB 200 MG PO CAPS: ORAL_CAPSULE | ORAL | Status: DC

## 2012-09-06 MED ORDER — ASPIRIN 325 MG PO TBEC: DELAYED_RELEASE_TABLET | ORAL | Status: DC

## 2012-09-06 MED ORDER — BISACODYL 5 MG PO TBEC: DELAYED_RELEASE_TABLET | ORAL | Status: DC

## 2012-09-06 MED ORDER — BUPIVACAINE HCL (PF) 0.75 % IJ SOLN
INTRAMUSCULAR | Status: DC | PRN
  Administered 2012-09-05: 15 mg via INTRATHECAL

## 2012-09-06 MED ORDER — LORAZEPAM 1 MG PO TABS: 1.0000 mg | ORAL_TABLET | Freq: Four times a day (QID) | ORAL | Status: DC | PRN

## 2012-09-06 MED ORDER — LORAZEPAM 1 MG PO TABS
1.0000 mg | ORAL_TABLET | Freq: Four times a day (QID) | ORAL | Status: DC | PRN
  Administered 2012-09-06: 1 mg via ORAL
  Filled 2012-09-06: qty 1

## 2012-09-06 MED ORDER — OXYCODONE HCL 5 MG PO TABS: ORAL_TABLET | ORAL | Status: DC

## 2012-09-06 NOTE — Progress Notes (Signed)
Patient was discharged home with the wife via wheelchair and [employer]. All of their questions were explained and the discharge instructions were reviewed.

## 2012-09-06 NOTE — Progress Notes (Signed)
Physical Therapy Treatment Patient Details Name: Willie Turner MRN: [id number] DOB: 1940-09-10 Today's Date: 09/06/2012 Time: 1324-4010 PT Time Calculation (min): 41 min  PT Assessment / Plan / Recommendation  History of Present Illness s/p elective Rt TKA    PT Comments   Continues to have R stance instability effecting safety with mobility; will benefit from another PT session prior to dc to continue practicing gait and stairs   Follow Up Recommendations  Home health PT;Supervision/Assistance - 24 hour     Does the patient have the potential to tolerate intense rehabilitation     Barriers to Discharge        Equipment Recommendations  None recommended by PT    Recommendations for Other Services OT consult  Frequency 7X/week   Progress towards PT Goals Progress towards PT goals: Progressing toward goals  Plan Current plan remains appropriate    Precautions / Restrictions Precautions Precautions: Fall;Knee Required Braces or Orthoses: Knee Immobilizer - Right Knee Immobilizer - Right: Discontinue once straight leg raise with < 10 degree lag Restrictions Weight Bearing Restrictions: Yes RLE Weight Bearing: Weight bearing as tolerated   Pertinent Vitals/Pain Soreness behind knee; did not rate patient repositioned for comfort     Mobility  Bed Mobility Bed Mobility: Supine to Sit;Sitting - Scoot to Edge of Bed;Sit to Supine Supine to Sit: 4: Min guard;HOB flat Sitting - Scoot to Edge of Bed: 5: Supervision Sit to Supine: 4: Min guard Details for Bed Mobility Assistance: cues for hand placement and sequencing Transfers Transfers: Sit to Stand;Stand to Sit Sit to Stand: 3: Mod assist;From bed;From elevated surface;4: Min assist Stand to Sit: 4: Min assist;To chair/3-in-1;With armrests;With upper extremity assist Details for Transfer Assistance: Mod assist initially with sit to stand, mostly for steadiness and balance; Improved performance with practice; knee continues  to buckle Ambulation/Gait Ambulation/Gait Assistance: 3: Mod assist Ambulation Distance (Feet): 50 Feet Assistive device: Rolling walker Ambulation/Gait Assistance Details: Cues for gait sequence and to activaqte R quad for stance stability; Used KI secondary to knee buckling, and pt continued to show R stance instability in KI, occasionally needing physical block; Cues for posture Gait Pattern: Decreased stance time - right;Decreased step length - left;Narrow base of support;Trunk flexed Gait velocity: decreased; Rt LE buckling Stairs: Yes Stairs Assistance: 3: Mod assist;2: Max assist Stairs Assistance Details (indicate cue type and reason): Verbal and demo cues for technique; Requires max repetitiion; One loss of balance requiring max assist to regain balance Stair Management Technique: Two rails;Step to pattern;Forwards Number of Stairs: 2 Wheelchair Mobility Wheelchair Mobility: No    Exercises Total Joint Exercises Quad Sets:  (while in footsie roll)   PT Diagnosis:    PT Problem List:   PT Treatment Interventions:     PT Goals (current goals can now be found in the care plan section) Acute Rehab PT Goals Patient Stated Goal: to walk without pain PT Goal Formulation: With patient Time For Goal Achievement: 09/12/12 Potential to Achieve Goals: Good  Visit Information  Last PT Received On: 09/06/12 Assistance Needed: +1 History of Present Illness: s/p elective Rt TKA     Subjective Data  Subjective: Wife is nervous re: dc home Patient Stated Goal: to walk without pain   Cognition  Cognition Arousal/Alertness: Awake/alert Behavior During Therapy: WFL for tasks assessed/performed Overall Cognitive Status: Within Functional Limits for tasks assessed Memory: Decreased short-term memory (Needing max step-by-step cues)    Balance     End of Session PT - End of  Session Equipment Utilized During Treatment: Gait belt;Right knee immobilizer Activity Tolerance: Patient  tolerated treatment well Patient left: in chair;with call bell/phone within reach;with nursing/sitter in room Nurse Communication: Mobility status;Patient requests pain meds   GP     Van Clines [facility] [place], [place] [phone]  09/06/2012, 11:10 AM

## 2012-09-06 NOTE — Progress Notes (Signed)
Physical Therapy Note  Better performance with gait, stairs; Pt and wife seem more confident about going home; Noted for dc home soon; discussed with RN  Less pain than earlier session; patient repositioned for comfort   09/06/12 1100  PT Visit Information  Last PT Received On 09/06/12  Assistance Needed +1  History of Present Illness s/p elective Rt TKA   PT Time Calculation  PT Start Time 1020  PT Stop Time 1043  PT Time Calculation (min) 23 min  Subjective Data  Subjective pt and wife are more confident about going home  Patient Stated Goal to walk without pain  Precautions  Precautions Fall;Knee  Required Braces or Orthoses Knee Immobilizer - Right  Knee Immobilizer - Right Discontinue once straight leg raise with < 10 degree lag  Restrictions  Weight Bearing Restrictions Yes  RLE Weight Bearing WBAT  Cognition  Arousal/Alertness Awake/alert  Behavior During Therapy WFL for tasks assessed/performed  Overall Cognitive Status Within Functional Limits for tasks assessed  Memory Decreased short-term memory (needing less cues than earlier)  Transfers  Transfers Sit to Stand;Stand to Sit  Sit to Stand From bed;From elevated surface;4: Min guard  Stand to Sit To chair/3-in-1;With armrests;With upper extremity assist;4: Min guard  Details for Transfer Assistance Improving performance; continued need for cues for hand placement and safety  Ambulation/Gait  Ambulation/Gait Assistance 4: Min guard  Ambulation Distance (Feet) 50 Feet  Assistive device Rolling walker  Ambulation/Gait Assistance Details Much less knee buckling; cues for shorter steps for safety, and to take everything slowly as he was a bit impulsive  Gait Pattern Narrow base of support;Trunk flexed  Stairs Yes  Stairs Assistance 3: Mod assist;2: Max assist  Stairs Assistance Details (indicate cue type and reason) Continued need for cues, but much better; wife verbalized understnading  Stair Management Technique  Two rails;Step to pattern;Forwards  Number of Stairs 2 (x2)  Engineering geologist No  Exercises  Exercises Total Joint  Total Joint Exercises  Quad Sets AROM;AAROM;Right;10 reps;Seated  Straight Leg Raises AAROM;Right;10 reps  PT - End of Session  Equipment Utilized During Treatment Gait belt;Right knee immobilizer  Activity Tolerance Patient tolerated treatment well  Patient left in chair;with call bell/phone within reach;with nursing/sitter in room  Nurse Communication Mobility status;Patient requests pain meds  PT - Assessment/Plan  PT Plan Current plan remains appropriate  PT Frequency 7X/week  Recommendations for Other Services OT consult  Follow Up Recommendations Home health PT;Supervision/Assistance - 24 hour  PT equipment None recommended by PT  PT Goal Progression  Progress towards PT goals Progressing toward goals  Acute Rehab PT Goals  PT Goal Formulation With patient  Time For Goal Achievement 09/12/12  Potential to Achieve Goals Good  PT General Charges  $$ ACUTE PT VISIT 1 Procedure  PT Treatments  $Gait Training 23-37 mins   [place], [place] [phone]

## 2012-09-06 NOTE — Discharge Summary (Signed)
Patient ID: Willie Turner MRN: [id number] DOB/AGE: 1940-09-01 72 y.o.  Admit date: 09/05/2012 Discharge date: 09/06/2012  Admission Diagnoses:  Principal Problem:   Right knee DJD Active Problems:   COPD (chronic obstructive pulmonary disease)   Shortness of breath   Arthritis   Hypertension   Asthma   Bronchitis   Alcohol dependence   Discharge Diagnoses:  Same  Past Medical History  Diagnosis Date  . COPD (chronic obstructive pulmonary disease)   . Shortness of breath   . GERD (gastroesophageal reflux disease)   . Arthritis   . Hypertension     not on meds  . Asthma   . Bronchitis   . Right knee DJD     Surgeries: Procedure(s): TOTAL KNEE ARTHROPLASTY on 09/05/2012   Consultants:    Discharged Condition: Improved  Hospital Course: Willie Turner is an 72 y.o. male who was admitted 09/05/2012 for operative treatment ofRight knee DJD. Patient has severe unremitting pain that affects sleep, daily activities, and work/hobbies. After pre-op clearance the patient was taken to the operating room on 09/05/2012 and underwent  Procedure(s): TOTAL KNEE ARTHROPLASTY.    Patient was given perioperative antibiotics: Anti-infectives   Start     Dose/Rate Route Frequency Ordered Stop   09/05/12 1500  ceFAZolin (ANCEF) IVPB 2 g/50 mL premix     2 g 100 mL/hr over 30 Minutes Intravenous Every 6 hours 09/05/12 1325 09/05/12 2150   09/04/12 1150  ceFAZolin (ANCEF) IVPB 2 g/50 mL premix  Status:  Discontinued     2 g 100 mL/hr over 30 Minutes Intravenous On call to O.R. 09/04/12 1150 09/05/12 1326       Patient was given sequential compression devices, early ambulation, and chemoprophylaxis to prevent DVT.  Patient benefited maximally from hospital stay and there were no complications.    Recent vital signs: Patient Vitals for the past 24 hrs:  BP Temp Pulse Resp SpO2  09/06/12 0920 - - - - 91 %  09/06/12 0919 - - - - 91 %  09/06/12 0800 - - - 16 -  09/06/12 0529 124/70  mmHg 98.1 F (36.7 C) 90 16 95 %  09/06/12 0400 - - - 16 96 %  09/06/12 0225 169/97 mmHg 97.7 F (36.5 C) 109 18 94 %  09/06/12 0000 - - - 17 95 %  09/05/12 2040 130/67 mmHg 98.5 F (36.9 C) 75 16 96 %  09/05/12 2000 - - - 16 95 %  09/05/12 1354 - - - - 95 %  09/05/12 1345 132/84 mmHg 97.9 F (36.6 C) 79 18 95 %  09/05/12 1245 132/76 mmHg 97.8 F (36.6 C) - - 93 %  09/05/12 1230 123/78 mmHg - 71 20 92 %  09/05/12 1215 130/74 mmHg - 66 14 93 %  09/05/12 1200 121/73 mmHg - 61 11 92 %  09/05/12 1145 126/80 mmHg - 64 16 95 %  09/05/12 1135 138/83 mmHg - 60 9 94 %  09/05/12 1120 126/75 mmHg 97.7 F (36.5 C) 75 14 97 %     Recent laboratory studies:  Recent Labs  09/06/12 0430  WBC 11.4*  HGB 14.2  HCT 40.7  PLT 239  NA 135  K 4.0  CL 102  CO2 22  BUN 15  CREATININE 0.80  GLUCOSE 175*  CALCIUM 9.2     Discharge Medications:     Medication List    STOP taking these medications       meloxicam 15 MG tablet  Commonly known as:  MOBIC     naproxen sodium 220 MG tablet  Commonly known as:  ANAPROX      TAKE these medications       albuterol 108 (90 BASE) MCG/ACT inhaler  Commonly known as:  PROVENTIL HFA;VENTOLIN HFA  Inhale 1 puff into the lungs every 4 (four) hours as needed for wheezing.     aspirin 325 MG EC tablet  1 tablet daily to prevent blood clots     bisacodyl 5 MG EC tablet  Commonly known as:  DULCOLAX  Take 2 tablets every night with dinner until bowel movement.  LAXITIVE.  Restart if two days since last bowel movement     celecoxib 200 MG capsule  Commonly known as:  CELEBREX  1 po daily with food for pain and swelling     DSS 100 MG Caps  1 tab 2 times a day while on narcotics.  STOOL SOFTENER     fexofenadine 180 MG tablet  Commonly known as:  ALLEGRA  Take 180 mg by mouth daily as needed (for allergies).     fish oil-omega-3 fatty acids 1000 MG capsule  Take 1 g by mouth daily as needed (for vitamin).     Fluticasone-Salmeterol  250-50 MCG/DOSE Aepb  Commonly known as:  ADVAIR  Inhale 1 puff into the lungs every 12 (twelve) hours.     LORazepam 1 MG tablet  Commonly known as:  ATIVAN  Take 1 tablet (1 mg total) by mouth every 6 (six) hours as needed for anxiety (alcohol withdrawl).     multivitamin with minerals Tabs tablet  Take 1 tablet by mouth daily.     omeprazole 20 MG capsule  Commonly known as:  PRILOSEC  Take 20 mg by mouth daily.     oxyCODONE 5 MG immediate release tablet  Commonly known as:  Oxy IR/ROXICODONE  1-2 tablets every 4-6 hrs as needed for pain        Diagnostic Studies: Dg Chest 2 View  08/29/2012   *RADIOLOGY REPORT*  Clinical Data: Shortness of breath.  Preop knee arthroplasty.  CHEST - 2 VIEW  Comparison: 03/10/2011.  Findings: Trachea is midline.  Heart size normal. Lungs are clear. No pleural fluid.  IMPRESSION: No acute findings.   Original Report Authenticated By: Leanna Battles, M.D.    Disposition: Final discharge disposition not confirmed      Discharge Orders   Future Orders Complete By Expires   Call MD / Call 911  As directed    Comments:     If you experience chest pain or shortness of breath, CALL 911 and be transported to the hospital emergency room.  If you develope a fever above 101 F, pus (white drainage) or increased drainage or redness at the wound, or calf pain, call your surgeon's office.   Change dressing  As directed    Comments:     Change the dressing daily with sterile 4 x 4 inch gauze dressing and apply TED hose.  You may clean the incision with alcohol prior to redressing.   Constipation Prevention  As directed    Comments:     Drink plenty of fluids.  Prune juice may be helpful.  You may use a stool softener, such as Colace (over the counter) 100 mg twice a day.  Use MiraLax (over the counter) for constipation as needed.   CPM  As directed    Comments:     Continuous passive motion machine (CPM):  Use the CPM from 0 to 90 for 6 hours per  day.       You may break it up into 2 or 3 sessions per day.      Use CPM for 2 weeks or until you are told to stop.   Diet - low sodium heart healthy  As directed    Discharge instructions  As directed    Comments:     Total Knee Replacement Care After Refer to this sheet in the next few weeks. These discharge instructions provide you with general information on caring for yourself after you leave the hospital. Your caregiver may also give you specific instructions. Your treatment has been planned according to the most current medical practices available, but unavoidable complications sometimes occur. If you have any problems or questions after discharge, please call your caregiver. Regaining a near full range of motion of your knee within the first 3 to 6 weeks after surgery is critical. HOME CARE INSTRUCTIONS  You may resume a normal diet and activities as directed.  Perform exercises as directed.  Place gray foam block, curve side up under heel at all times except when in CPM or when walking.  DO NOT modify, tear, cut, or change in any way the gray foam block. You will receive physical therapy daily  Take showers instead of baths until informed otherwise.  You may shower on Sunday.  Please wash whole leg including wound with soap and water  Change bandages (dressings)daily It is OK to take over-the-counter tylenol in addition to the oxycodone for pain, discomfort, or fever. Oxycodone is VERY constipating.  Please take stool softener twice a day and laxatives daily until bowels are regular Eat a well-balanced diet.  Avoid lifting or driving until you are instructed otherwise.  Make an appointment to see your caregiver for stitches (suture) or staple removal as directed.  If you have been sent home with a continuous passive motion machine (CPM machine), 0-90 degrees 6 hrs a day   2 hrs a shift SEEK MEDICAL CARE IF: You have swelling of your calf or leg.  You develop shortness of breath or  chest pain.  You have redness, swelling, or increasing pain in the wound.  There is pus or any unusual drainage coming from the surgical site.  You notice a bad smell coming from the surgical site or dressing.  The surgical site breaks open after sutures or staples have been removed.  There is persistent bleeding from the suture or staple line.  You are getting worse or are not improving.  You have any other questions or concerns.  SEEK IMMEDIATE MEDICAL CARE IF:  You have a fever.  You develop a rash.  You have difficulty breathing.  You develop any reaction or side effects to medicines given.  Your knee motion is decreasing rather than improving.  MAKE SURE YOU:  Understand these instructions.  Will watch your condition.  Will get help right away if you are not doing well or get worse.   Do not put a pillow under the knee. Place it under the heel.  As directed    Comments:     Place gray foam block, curve side up under heel at all times except when in CPM or when walking.  DO NOT modify, tear, cut, or change in any way the gray foam block.   Increase activity slowly as tolerated  As directed    TED hose  As directed    Comments:  Use stockings (TED hose) for 2 weeks on both leg(s).  You may remove them at night for sleeping.      Follow-up Information   Follow up with Nilda Simmer, MD On 09/20/2012. (appt time 9:15 am)    Specialty:  Orthopedic Surgery   Contact information:   [address] [place] [address] [phone]        Signed: Pascal Lux 09/06/2012, 9:35 AM

## 2012-09-06 NOTE — Addendum Note (Signed)
Addendum created 09/06/12 1610 by Rosezella Florida, MD   Modules edited: Anesthesia Medication Administration

## 2012-09-06 NOTE — Progress Notes (Signed)
09/06/12 Patient set up with HHPT with [facility] by MD office. Gave patient [facility] tel #. Rolling walker, 3N1 and CPM proivided by [employer]. Jacquelynn Cree RN, BSN, CCM

## 2012-09-07 DIAGNOSIS — I10 Essential (primary) hypertension: Secondary | ICD-10-CM | POA: Diagnosis not present

## 2012-09-07 DIAGNOSIS — M159 Polyosteoarthritis, unspecified: Secondary | ICD-10-CM | POA: Diagnosis not present

## 2012-09-07 DIAGNOSIS — J45909 Unspecified asthma, uncomplicated: Secondary | ICD-10-CM | POA: Diagnosis not present

## 2012-09-07 DIAGNOSIS — IMO0001 Reserved for inherently not codable concepts without codable children: Secondary | ICD-10-CM | POA: Diagnosis not present

## 2012-09-07 DIAGNOSIS — J449 Chronic obstructive pulmonary disease, unspecified: Secondary | ICD-10-CM | POA: Diagnosis not present

## 2012-09-07 DIAGNOSIS — Z471 Aftercare following joint replacement surgery: Secondary | ICD-10-CM | POA: Diagnosis not present

## 2012-09-08 DIAGNOSIS — IMO0001 Reserved for inherently not codable concepts without codable children: Secondary | ICD-10-CM | POA: Diagnosis not present

## 2012-09-08 DIAGNOSIS — J45909 Unspecified asthma, uncomplicated: Secondary | ICD-10-CM | POA: Diagnosis not present

## 2012-09-08 DIAGNOSIS — I10 Essential (primary) hypertension: Secondary | ICD-10-CM | POA: Diagnosis not present

## 2012-09-08 DIAGNOSIS — J449 Chronic obstructive pulmonary disease, unspecified: Secondary | ICD-10-CM | POA: Diagnosis not present

## 2012-09-08 DIAGNOSIS — M159 Polyosteoarthritis, unspecified: Secondary | ICD-10-CM | POA: Diagnosis not present

## 2012-09-08 DIAGNOSIS — Z471 Aftercare following joint replacement surgery: Secondary | ICD-10-CM | POA: Diagnosis not present

## 2012-09-09 DIAGNOSIS — IMO0001 Reserved for inherently not codable concepts without codable children: Secondary | ICD-10-CM | POA: Diagnosis not present

## 2012-09-09 DIAGNOSIS — M159 Polyosteoarthritis, unspecified: Secondary | ICD-10-CM | POA: Diagnosis not present

## 2012-09-09 DIAGNOSIS — I10 Essential (primary) hypertension: Secondary | ICD-10-CM | POA: Diagnosis not present

## 2012-09-09 DIAGNOSIS — Z471 Aftercare following joint replacement surgery: Secondary | ICD-10-CM | POA: Diagnosis not present

## 2012-09-09 DIAGNOSIS — J45909 Unspecified asthma, uncomplicated: Secondary | ICD-10-CM | POA: Diagnosis not present

## 2012-09-09 DIAGNOSIS — J449 Chronic obstructive pulmonary disease, unspecified: Secondary | ICD-10-CM | POA: Diagnosis not present

## 2012-09-12 DIAGNOSIS — M159 Polyosteoarthritis, unspecified: Secondary | ICD-10-CM | POA: Diagnosis not present

## 2012-09-12 DIAGNOSIS — J45909 Unspecified asthma, uncomplicated: Secondary | ICD-10-CM | POA: Diagnosis not present

## 2012-09-12 DIAGNOSIS — IMO0001 Reserved for inherently not codable concepts without codable children: Secondary | ICD-10-CM | POA: Diagnosis not present

## 2012-09-12 DIAGNOSIS — J449 Chronic obstructive pulmonary disease, unspecified: Secondary | ICD-10-CM | POA: Diagnosis not present

## 2012-09-12 DIAGNOSIS — Z471 Aftercare following joint replacement surgery: Secondary | ICD-10-CM | POA: Diagnosis not present

## 2012-09-12 DIAGNOSIS — I10 Essential (primary) hypertension: Secondary | ICD-10-CM | POA: Diagnosis not present

## 2012-09-13 DIAGNOSIS — I10 Essential (primary) hypertension: Secondary | ICD-10-CM | POA: Diagnosis not present

## 2012-09-13 DIAGNOSIS — J449 Chronic obstructive pulmonary disease, unspecified: Secondary | ICD-10-CM | POA: Diagnosis not present

## 2012-09-13 DIAGNOSIS — M159 Polyosteoarthritis, unspecified: Secondary | ICD-10-CM | POA: Diagnosis not present

## 2012-09-13 DIAGNOSIS — IMO0001 Reserved for inherently not codable concepts without codable children: Secondary | ICD-10-CM | POA: Diagnosis not present

## 2012-09-13 DIAGNOSIS — Z471 Aftercare following joint replacement surgery: Secondary | ICD-10-CM | POA: Diagnosis not present

## 2012-09-13 DIAGNOSIS — J45909 Unspecified asthma, uncomplicated: Secondary | ICD-10-CM | POA: Diagnosis not present

## 2012-09-14 DIAGNOSIS — M159 Polyosteoarthritis, unspecified: Secondary | ICD-10-CM | POA: Diagnosis not present

## 2012-09-14 DIAGNOSIS — J449 Chronic obstructive pulmonary disease, unspecified: Secondary | ICD-10-CM | POA: Diagnosis not present

## 2012-09-14 DIAGNOSIS — I10 Essential (primary) hypertension: Secondary | ICD-10-CM | POA: Diagnosis not present

## 2012-09-14 DIAGNOSIS — IMO0001 Reserved for inherently not codable concepts without codable children: Secondary | ICD-10-CM | POA: Diagnosis not present

## 2012-09-14 DIAGNOSIS — J45909 Unspecified asthma, uncomplicated: Secondary | ICD-10-CM | POA: Diagnosis not present

## 2012-09-14 DIAGNOSIS — Z471 Aftercare following joint replacement surgery: Secondary | ICD-10-CM | POA: Diagnosis not present

## 2012-09-16 DIAGNOSIS — J45909 Unspecified asthma, uncomplicated: Secondary | ICD-10-CM | POA: Diagnosis not present

## 2012-09-16 DIAGNOSIS — IMO0001 Reserved for inherently not codable concepts without codable children: Secondary | ICD-10-CM | POA: Diagnosis not present

## 2012-09-16 DIAGNOSIS — J449 Chronic obstructive pulmonary disease, unspecified: Secondary | ICD-10-CM | POA: Diagnosis not present

## 2012-09-16 DIAGNOSIS — Z471 Aftercare following joint replacement surgery: Secondary | ICD-10-CM | POA: Diagnosis not present

## 2012-09-16 DIAGNOSIS — M159 Polyosteoarthritis, unspecified: Secondary | ICD-10-CM | POA: Diagnosis not present

## 2012-09-16 DIAGNOSIS — I10 Essential (primary) hypertension: Secondary | ICD-10-CM | POA: Diagnosis not present

## 2012-09-19 DIAGNOSIS — Z471 Aftercare following joint replacement surgery: Secondary | ICD-10-CM | POA: Diagnosis not present

## 2012-09-19 DIAGNOSIS — M159 Polyosteoarthritis, unspecified: Secondary | ICD-10-CM | POA: Diagnosis not present

## 2012-09-19 DIAGNOSIS — IMO0001 Reserved for inherently not codable concepts without codable children: Secondary | ICD-10-CM | POA: Diagnosis not present

## 2012-09-19 DIAGNOSIS — J45909 Unspecified asthma, uncomplicated: Secondary | ICD-10-CM | POA: Diagnosis not present

## 2012-09-19 DIAGNOSIS — I10 Essential (primary) hypertension: Secondary | ICD-10-CM | POA: Diagnosis not present

## 2012-09-19 DIAGNOSIS — J449 Chronic obstructive pulmonary disease, unspecified: Secondary | ICD-10-CM | POA: Diagnosis not present

## 2012-09-20 DIAGNOSIS — Z96659 Presence of unspecified artificial knee joint: Secondary | ICD-10-CM | POA: Diagnosis not present

## 2012-09-20 DIAGNOSIS — Z471 Aftercare following joint replacement surgery: Secondary | ICD-10-CM | POA: Diagnosis not present

## 2012-09-21 DIAGNOSIS — IMO0001 Reserved for inherently not codable concepts without codable children: Secondary | ICD-10-CM | POA: Diagnosis not present

## 2012-09-21 DIAGNOSIS — J45909 Unspecified asthma, uncomplicated: Secondary | ICD-10-CM | POA: Diagnosis not present

## 2012-09-21 DIAGNOSIS — I10 Essential (primary) hypertension: Secondary | ICD-10-CM | POA: Diagnosis not present

## 2012-09-21 DIAGNOSIS — M159 Polyosteoarthritis, unspecified: Secondary | ICD-10-CM | POA: Diagnosis not present

## 2012-09-21 DIAGNOSIS — J449 Chronic obstructive pulmonary disease, unspecified: Secondary | ICD-10-CM | POA: Diagnosis not present

## 2012-09-21 DIAGNOSIS — Z471 Aftercare following joint replacement surgery: Secondary | ICD-10-CM | POA: Diagnosis not present

## 2012-09-23 DIAGNOSIS — J449 Chronic obstructive pulmonary disease, unspecified: Secondary | ICD-10-CM | POA: Diagnosis not present

## 2012-09-23 DIAGNOSIS — I10 Essential (primary) hypertension: Secondary | ICD-10-CM | POA: Diagnosis not present

## 2012-09-23 DIAGNOSIS — Z471 Aftercare following joint replacement surgery: Secondary | ICD-10-CM | POA: Diagnosis not present

## 2012-09-23 DIAGNOSIS — J45909 Unspecified asthma, uncomplicated: Secondary | ICD-10-CM | POA: Diagnosis not present

## 2012-09-23 DIAGNOSIS — IMO0001 Reserved for inherently not codable concepts without codable children: Secondary | ICD-10-CM | POA: Diagnosis not present

## 2012-09-23 DIAGNOSIS — M159 Polyosteoarthritis, unspecified: Secondary | ICD-10-CM | POA: Diagnosis not present

## 2012-09-27 DIAGNOSIS — R262 Difficulty in walking, not elsewhere classified: Secondary | ICD-10-CM | POA: Diagnosis not present

## 2012-09-27 DIAGNOSIS — M1710 Unilateral primary osteoarthritis, unspecified knee: Secondary | ICD-10-CM | POA: Diagnosis not present

## 2012-09-29 DIAGNOSIS — M1710 Unilateral primary osteoarthritis, unspecified knee: Secondary | ICD-10-CM | POA: Diagnosis not present

## 2012-09-29 DIAGNOSIS — R262 Difficulty in walking, not elsewhere classified: Secondary | ICD-10-CM | POA: Diagnosis not present

## 2012-09-30 DIAGNOSIS — M1710 Unilateral primary osteoarthritis, unspecified knee: Secondary | ICD-10-CM | POA: Diagnosis not present

## 2012-09-30 DIAGNOSIS — R262 Difficulty in walking, not elsewhere classified: Secondary | ICD-10-CM | POA: Diagnosis not present

## 2012-10-03 DIAGNOSIS — R262 Difficulty in walking, not elsewhere classified: Secondary | ICD-10-CM | POA: Diagnosis not present

## 2012-10-03 DIAGNOSIS — M1710 Unilateral primary osteoarthritis, unspecified knee: Secondary | ICD-10-CM | POA: Diagnosis not present

## 2012-10-05 DIAGNOSIS — R262 Difficulty in walking, not elsewhere classified: Secondary | ICD-10-CM | POA: Diagnosis not present

## 2012-10-05 DIAGNOSIS — M1710 Unilateral primary osteoarthritis, unspecified knee: Secondary | ICD-10-CM | POA: Diagnosis not present

## 2012-10-07 DIAGNOSIS — M1710 Unilateral primary osteoarthritis, unspecified knee: Secondary | ICD-10-CM | POA: Diagnosis not present

## 2012-10-07 DIAGNOSIS — R262 Difficulty in walking, not elsewhere classified: Secondary | ICD-10-CM | POA: Diagnosis not present

## 2012-10-10 DIAGNOSIS — R262 Difficulty in walking, not elsewhere classified: Secondary | ICD-10-CM | POA: Diagnosis not present

## 2012-10-10 DIAGNOSIS — M1710 Unilateral primary osteoarthritis, unspecified knee: Secondary | ICD-10-CM | POA: Diagnosis not present

## 2012-10-11 DIAGNOSIS — Z23 Encounter for immunization: Secondary | ICD-10-CM | POA: Diagnosis not present

## 2012-10-12 DIAGNOSIS — M1710 Unilateral primary osteoarthritis, unspecified knee: Secondary | ICD-10-CM | POA: Diagnosis not present

## 2012-10-12 DIAGNOSIS — R262 Difficulty in walking, not elsewhere classified: Secondary | ICD-10-CM | POA: Diagnosis not present

## 2012-10-14 DIAGNOSIS — M1710 Unilateral primary osteoarthritis, unspecified knee: Secondary | ICD-10-CM | POA: Diagnosis not present

## 2012-10-14 DIAGNOSIS — R262 Difficulty in walking, not elsewhere classified: Secondary | ICD-10-CM | POA: Diagnosis not present

## 2012-10-17 DIAGNOSIS — M1710 Unilateral primary osteoarthritis, unspecified knee: Secondary | ICD-10-CM | POA: Diagnosis not present

## 2012-10-17 DIAGNOSIS — R262 Difficulty in walking, not elsewhere classified: Secondary | ICD-10-CM | POA: Diagnosis not present

## 2012-10-19 DIAGNOSIS — M1710 Unilateral primary osteoarthritis, unspecified knee: Secondary | ICD-10-CM | POA: Diagnosis not present

## 2012-10-19 DIAGNOSIS — R262 Difficulty in walking, not elsewhere classified: Secondary | ICD-10-CM | POA: Diagnosis not present

## 2012-10-20 DIAGNOSIS — H2510 Age-related nuclear cataract, unspecified eye: Secondary | ICD-10-CM | POA: Diagnosis not present

## 2012-10-20 DIAGNOSIS — H53039 Strabismic amblyopia, unspecified eye: Secondary | ICD-10-CM | POA: Diagnosis not present

## 2012-10-20 DIAGNOSIS — H52229 Regular astigmatism, unspecified eye: Secondary | ICD-10-CM | POA: Diagnosis not present

## 2012-10-20 DIAGNOSIS — H5210 Myopia, unspecified eye: Secondary | ICD-10-CM | POA: Diagnosis not present

## 2012-10-21 DIAGNOSIS — R262 Difficulty in walking, not elsewhere classified: Secondary | ICD-10-CM | POA: Diagnosis not present

## 2012-10-21 DIAGNOSIS — M1710 Unilateral primary osteoarthritis, unspecified knee: Secondary | ICD-10-CM | POA: Diagnosis not present

## 2012-10-24 DIAGNOSIS — M1710 Unilateral primary osteoarthritis, unspecified knee: Secondary | ICD-10-CM | POA: Diagnosis not present

## 2012-10-24 DIAGNOSIS — R262 Difficulty in walking, not elsewhere classified: Secondary | ICD-10-CM | POA: Diagnosis not present

## 2012-10-26 DIAGNOSIS — M1710 Unilateral primary osteoarthritis, unspecified knee: Secondary | ICD-10-CM | POA: Diagnosis not present

## 2012-10-26 DIAGNOSIS — R262 Difficulty in walking, not elsewhere classified: Secondary | ICD-10-CM | POA: Diagnosis not present

## 2012-10-28 DIAGNOSIS — M1710 Unilateral primary osteoarthritis, unspecified knee: Secondary | ICD-10-CM | POA: Diagnosis not present

## 2012-10-28 DIAGNOSIS — R262 Difficulty in walking, not elsewhere classified: Secondary | ICD-10-CM | POA: Diagnosis not present

## 2012-10-31 DIAGNOSIS — M1710 Unilateral primary osteoarthritis, unspecified knee: Secondary | ICD-10-CM | POA: Diagnosis not present

## 2012-10-31 DIAGNOSIS — R262 Difficulty in walking, not elsewhere classified: Secondary | ICD-10-CM | POA: Diagnosis not present

## 2012-11-02 DIAGNOSIS — R262 Difficulty in walking, not elsewhere classified: Secondary | ICD-10-CM | POA: Diagnosis not present

## 2012-11-02 DIAGNOSIS — M1710 Unilateral primary osteoarthritis, unspecified knee: Secondary | ICD-10-CM | POA: Diagnosis not present

## 2012-11-04 DIAGNOSIS — R262 Difficulty in walking, not elsewhere classified: Secondary | ICD-10-CM | POA: Diagnosis not present

## 2012-11-04 DIAGNOSIS — M1710 Unilateral primary osteoarthritis, unspecified knee: Secondary | ICD-10-CM | POA: Diagnosis not present

## 2012-11-07 DIAGNOSIS — R262 Difficulty in walking, not elsewhere classified: Secondary | ICD-10-CM | POA: Diagnosis not present

## 2012-11-07 DIAGNOSIS — M1710 Unilateral primary osteoarthritis, unspecified knee: Secondary | ICD-10-CM | POA: Diagnosis not present

## 2012-11-09 DIAGNOSIS — M1710 Unilateral primary osteoarthritis, unspecified knee: Secondary | ICD-10-CM | POA: Diagnosis not present

## 2012-11-09 DIAGNOSIS — R262 Difficulty in walking, not elsewhere classified: Secondary | ICD-10-CM | POA: Diagnosis not present

## 2012-11-11 DIAGNOSIS — R262 Difficulty in walking, not elsewhere classified: Secondary | ICD-10-CM | POA: Diagnosis not present

## 2012-11-11 DIAGNOSIS — M1710 Unilateral primary osteoarthritis, unspecified knee: Secondary | ICD-10-CM | POA: Diagnosis not present

## 2012-11-14 DIAGNOSIS — M1710 Unilateral primary osteoarthritis, unspecified knee: Secondary | ICD-10-CM | POA: Diagnosis not present

## 2012-11-14 DIAGNOSIS — R262 Difficulty in walking, not elsewhere classified: Secondary | ICD-10-CM | POA: Diagnosis not present

## 2012-11-16 DIAGNOSIS — R262 Difficulty in walking, not elsewhere classified: Secondary | ICD-10-CM | POA: Diagnosis not present

## 2012-11-16 DIAGNOSIS — M1710 Unilateral primary osteoarthritis, unspecified knee: Secondary | ICD-10-CM | POA: Diagnosis not present

## 2012-11-17 DIAGNOSIS — R262 Difficulty in walking, not elsewhere classified: Secondary | ICD-10-CM | POA: Diagnosis not present

## 2012-11-17 DIAGNOSIS — M1710 Unilateral primary osteoarthritis, unspecified knee: Secondary | ICD-10-CM | POA: Diagnosis not present

## 2013-02-21 DIAGNOSIS — Z96659 Presence of unspecified artificial knee joint: Secondary | ICD-10-CM | POA: Diagnosis not present

## 2013-02-21 DIAGNOSIS — Z471 Aftercare following joint replacement surgery: Secondary | ICD-10-CM | POA: Diagnosis not present

## 2013-05-23 DIAGNOSIS — M1710 Unilateral primary osteoarthritis, unspecified knee: Secondary | ICD-10-CM | POA: Diagnosis not present

## 2013-10-12 DIAGNOSIS — Z23 Encounter for immunization: Secondary | ICD-10-CM | POA: Diagnosis not present

## 2013-10-12 DIAGNOSIS — Z6828 Body mass index (BMI) 28.0-28.9, adult: Secondary | ICD-10-CM | POA: Diagnosis not present

## 2013-10-12 DIAGNOSIS — J45909 Unspecified asthma, uncomplicated: Secondary | ICD-10-CM | POA: Diagnosis not present

## 2013-10-17 DIAGNOSIS — M7750 Other enthesopathy of unspecified foot: Secondary | ICD-10-CM | POA: Diagnosis not present

## 2014-02-22 DIAGNOSIS — M1712 Unilateral primary osteoarthritis, left knee: Secondary | ICD-10-CM | POA: Diagnosis not present

## 2014-03-22 DIAGNOSIS — K219 Gastro-esophageal reflux disease without esophagitis: Secondary | ICD-10-CM | POA: Diagnosis not present

## 2014-03-22 DIAGNOSIS — R7301 Impaired fasting glucose: Secondary | ICD-10-CM | POA: Diagnosis not present

## 2014-03-22 DIAGNOSIS — Z6828 Body mass index (BMI) 28.0-28.9, adult: Secondary | ICD-10-CM | POA: Diagnosis not present

## 2014-03-22 DIAGNOSIS — E782 Mixed hyperlipidemia: Secondary | ICD-10-CM | POA: Diagnosis not present

## 2014-03-26 DIAGNOSIS — R7301 Impaired fasting glucose: Secondary | ICD-10-CM | POA: Diagnosis not present

## 2014-03-26 DIAGNOSIS — K219 Gastro-esophageal reflux disease without esophagitis: Secondary | ICD-10-CM | POA: Diagnosis not present

## 2014-03-26 DIAGNOSIS — Z125 Encounter for screening for malignant neoplasm of prostate: Secondary | ICD-10-CM | POA: Diagnosis not present

## 2014-03-26 DIAGNOSIS — E782 Mixed hyperlipidemia: Secondary | ICD-10-CM | POA: Diagnosis not present

## 2014-04-04 DIAGNOSIS — R413 Other amnesia: Secondary | ICD-10-CM | POA: Diagnosis not present

## 2014-04-04 DIAGNOSIS — R5383 Other fatigue: Secondary | ICD-10-CM | POA: Diagnosis not present

## 2014-04-04 DIAGNOSIS — Z6828 Body mass index (BMI) 28.0-28.9, adult: Secondary | ICD-10-CM | POA: Diagnosis not present

## 2014-04-04 DIAGNOSIS — E663 Overweight: Secondary | ICD-10-CM | POA: Diagnosis not present

## 2014-05-22 ENCOUNTER — Encounter: Payer: Self-pay | Admitting: Neurology

## 2014-05-22 ENCOUNTER — Ambulatory Visit: Payer: Medicare Other | Admitting: Neurology

## 2014-05-22 ENCOUNTER — Ambulatory Visit (INDEPENDENT_AMBULATORY_CARE_PROVIDER_SITE_OTHER): Payer: Medicare Other | Admitting: Neurology

## 2014-05-22 VITALS — BP 124/86 | HR 65 | Resp 20

## 2014-05-22 DIAGNOSIS — R450 Nervousness: Secondary | ICD-10-CM

## 2014-05-22 DIAGNOSIS — F0391 Unspecified dementia with behavioral disturbance: Secondary | ICD-10-CM | POA: Diagnosis not present

## 2014-05-22 DIAGNOSIS — G3184 Mild cognitive impairment, so stated: Secondary | ICD-10-CM | POA: Diagnosis not present

## 2014-05-22 DIAGNOSIS — R451 Restlessness and agitation: Secondary | ICD-10-CM | POA: Insufficient documentation

## 2014-05-22 DIAGNOSIS — F03918 Unspecified dementia, unspecified severity, with other behavioral disturbance: Secondary | ICD-10-CM | POA: Insufficient documentation

## 2014-05-22 MED ORDER — MEMANTINE HCL ER 7 & 14 & 21 &28 MG PO CP24: 7.0000 mg | ORAL_CAPSULE | Freq: Two times a day (BID) | ORAL | Status: DC

## 2014-05-22 MED ORDER — MELATONIN 5 MG PO TABS: 5.0000 mg | ORAL_TABLET | Freq: Every evening | ORAL | Status: DC
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
## 2014-05-22 NOTE — Patient Instructions (Addendum)
I will start Mr. Willie Turner on Namenda which isn't memory supporting medication but usually mild and better tolerated than Aricept.  I understand that Aricept made him agitated and insomniac.  He was also more aggressive. His depression score is elevated and he may want to our next visit also to address depression however depression is very commonly associated with dementia findings he scored on a geographic depression scale today 6 points.Memantine Tablets What is this medicine? MEMANTINE (MEM an teen) is used to treat dementia caused by Alzheimer's disease. This medicine may be used for other purposes; ask your health care provider or pharmacist if you have questions. COMMON BRAND NAME(S): Namenda What should I tell my health care provider before I take this medicine? They need to know if you have any of these conditions: -difficulty passing urine -kidney disease -liver disease -seizures -an unusual or allergic reaction to memantine, other medicines, foods, dyes, or preservatives -pregnant or trying to get pregnant -breast-feeding How should I use this medicine? Take this medicine by mouth with a glass of water. Follow the directions on the prescription label. You may take this medicine with or without food. Take your doses at regular intervals. Do not take your medicine more often than directed. Continue to take your medicine even if you feel better. Do not stop taking except on the advice of your doctor or health care professional. Talk to your pediatrician regarding the use of this medicine in children. Special care may be needed. Overdosage: If you think you have taken too much of this medicine contact a poison control center or emergency room at once. NOTE: This medicine is only for you. Do not share this medicine with others. What if I miss a dose? If you miss a dose, take it as soon as you can. If it is almost time for your next dose, take only that dose. Do not take double or extra  doses. If you do not take your medicine for several days, contact your health care provider. Your dose may need to be changed. What may interact with this medicine? -acetazolamide -amantadine -cimetidine -dextromethorphan -dofetilide -hydrochlorothiazide -ketamine -metformin -methazolamide -quinidine -ranitidine -sodium bicarbonate -triamterene This list may not describe all possible interactions. Give your health care provider a list of all the medicines, herbs, non-prescription drugs, or dietary supplements you use. Also tell them if you smoke, drink alcohol, or use illegal drugs. Some items may interact with your medicine. What should I watch for while using this medicine? Visit your doctor or health care professional for regular checks on your progress. Check with your doctor or health care professional if there is no improvement in your symptoms or if they get worse. You may get drowsy or dizzy. Do not drive, use machinery, or do anything that needs mental alertness until you know how this drug affects you. Do not stand or sit up quickly, especially if you are an older patient. This reduces the risk of dizzy or fainting spells. Alcohol can make you more drowsy and dizzy. Avoid alcoholic drinks. What side effects may I notice from receiving this medicine? Side effects that you should report to your doctor or health care professional as soon as possible: -allergic reactions like skin rash, itching or hives, swelling of the face, lips, or tongue -agitation or a feeling of restlessness -depressed mood -dizziness -hallucinations -redness, blistering, peeling or loosening of the skin, including inside the mouth -seizures -vomiting Side effects that usually do not require medical attention (report to your doctor or  health care professional if they continue or are bothersome): -constipation -diarrhea -headache -nausea -trouble sleeping This list may not describe all possible side  effects. Call your doctor for medical advice about side effects. You may report side effects to FDA at 1-800-FDA-1088. Where should I keep my medicine? Keep out of the reach of children. Store at room temperature between 15 degrees and 30 degrees C (59 degrees and 86 degrees F). Throw away any unused medicine after the expiration date. NOTE: This sheet is a summary. It may not cover all possible information. If you have questions about this medicine, talk to your doctor, pharmacist, or health care provider.  2015, Elsevier/Gold Standard. (2012-10-24 14:10:42)

## 2014-05-24 ENCOUNTER — Other Ambulatory Visit: Payer: Self-pay | Admitting: *Deleted

## 2014-05-24 ENCOUNTER — Ambulatory Visit (INDEPENDENT_AMBULATORY_CARE_PROVIDER_SITE_OTHER): Payer: Medicare Other | Admitting: Neurology

## 2014-05-24 ENCOUNTER — Telehealth: Payer: Self-pay | Admitting: Neurology

## 2014-05-24 DIAGNOSIS — F0391 Unspecified dementia with behavioral disturbance: Secondary | ICD-10-CM | POA: Diagnosis not present

## 2014-05-24 DIAGNOSIS — F03918 Unspecified dementia, unspecified severity, with other behavioral disturbance: Secondary | ICD-10-CM

## 2014-05-24 MED ORDER — MEMANTINE HCL ER 7 & 14 & 21 &28 MG PO CP24: ORAL_CAPSULE | ORAL | Status: DC

## 2014-05-24 NOTE — Telephone Encounter (Signed)
I called back.  They were not able to get starter pack because it is on backorder.  I called the pharmacy to se if they can dispense 7mg  caps with titration instructions, but they do not have that dose on hand, and it would not arrive until next week if they ordered it.  We are checking to see if a sample pack can be obtained.  Willie Turner is aware.

## 2014-05-24 NOTE — Telephone Encounter (Signed)
Willie Turner, pt's husband called stating that she is having trouble getting the script for Memantine HCl ER (NAMENDA XR TITRATION PACK) 7 & 14 & 21 &28 MG CP24. She went to a couple of drug stores and they did not have the correct dosage for the script. Please call and advice # [phone]

## 2014-05-24 NOTE — Progress Notes (Signed)
Pt here for EEG.  Willie PassyJessica Turner, pharmacy tech, asked me to get a namenda xr titration pack sample ready for wife. I gave to her , LOT [id number]  Exp 07-2014, and gave her instructions how to take and information on namenda and side effects.  She appreciated this.  Will call back as needed.

## 2014-05-24 NOTE — Telephone Encounter (Signed)
Willie Turner was able to obtain a starter kit for the patient.  They are currently in the office for an EEG and will be given this sample pack.

## 2014-05-28 DIAGNOSIS — M1712 Unilateral primary osteoarthritis, left knee: Secondary | ICD-10-CM | POA: Diagnosis not present

## 2014-05-29 ENCOUNTER — Ambulatory Visit: Payer: Self-pay | Admitting: Orthopedic Surgery

## 2014-05-29 NOTE — Progress Notes (Signed)
Preoperative surgical orders have been place into the [facility] hospital system for Willie Turner on 05/29/2014, 11:38 AM  by Patrica DuelPERKINS, ALEXZANDREW for surgery on 06-27-2014.  Preop Total Knee orders including Experal, IV Tylenol, and IV Decadron as long as there are no contraindications to the above medications. Avel Peacerew Perkins, PA-C

## 2014-06-02 ENCOUNTER — Ambulatory Visit
Admission: RE | Admit: 2014-06-02 | Discharge: 2014-06-02 | Disposition: A | Discharge status: Home / Self Care | Payer: Medicare Other | Source: Ambulatory Visit | Attending: Neurology | Admitting: Neurology

## 2014-06-02 DIAGNOSIS — F0391 Unspecified dementia with behavioral disturbance: Secondary | ICD-10-CM | POA: Diagnosis not present

## 2014-06-02 DIAGNOSIS — F03918 Unspecified dementia, unspecified severity, with other behavioral disturbance: Secondary | ICD-10-CM

## 2014-06-15 ENCOUNTER — Telehealth: Payer: Self-pay

## 2014-06-15 NOTE — Procedures (Signed)
  [facility]  EEG (ELECTROENCEPHALOGRAM) REPORT   STUDY DATE:   05-24-14  [name] , [name]  PATIENT NAME: Willie Turner, Willie Turner   MRN: [id number]   ORDERING CLINICIAN: Melvyn Novasarmen Roshell Brigham, MD   TECHNIQUE: Electroencephalogram was recorded utilizing standard 10-20 system of lead placement and reformatted into average and bipolar montages.      RECORDING TIME: 40 minutes  ACTIVATION:  strobe lights and hyperventilation    CLINICAL INFORMATION:   The patient presents with deteriorating cognitive function, at times paranoid ideas, described by the family as hallucinatory. He also is unusually aggressive and threatening to members of his family.     FINDINGS: EEG  Background rhythm of  7.5-8 Hz ,  normal amplitude .  Photic stimulation caused  entrainment  ,no  epileptiform discharges, no periodic dischanges. I prevented relation was not fully completed. There is excessive beta fast artifact noted over the frontal areas this could be a medication side effect. Bilateral central temporal slowing is noted attributed to drowsiness. This did not correlate for slowing in the occipital region. There was slowing over the right parietal region noted but this did not lead to dysrhythmia in form of epileptiform discharges. Patient recorded in the awake/ drowsy but not sleep state. The EKG showed a 66 bpm normal sinus rhythm.   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 Please note that a right hemispheric slowing was noted especially in stages of drowsiness, MRI confirmation of any anatomical abnormality in this brain region is recommended. There was no epileptiform activity noted.      Melvyn Novasarmen Kasyn Stouffer , MD

## 2014-06-15 NOTE — Telephone Encounter (Signed)
Spoke to pt's wife Willie Turner [facility] and informed her of pt's EEG results per Dr. Vickey Hugerohmeier request. Informed her that the EEG was a little slow and that is normal with pt's with dementia and that Dr. Vickey Hugerohmeier will go over results more in detail with them on Tuesday at their appt. Gracie, pt's wife, verbalized understanding.

## 2014-06-19 ENCOUNTER — Encounter: Payer: Self-pay | Admitting: Neurology

## 2014-06-19 ENCOUNTER — Ambulatory Visit (INDEPENDENT_AMBULATORY_CARE_PROVIDER_SITE_OTHER): Payer: Medicare Other | Admitting: Neurology

## 2014-06-19 VITALS — BP 126/78 | HR 82 | Resp 20 | Ht 68.0 in | Wt 188.0 lb

## 2014-06-19 DIAGNOSIS — F0391 Unspecified dementia with behavioral disturbance: Secondary | ICD-10-CM | POA: Diagnosis not present

## 2014-06-19 DIAGNOSIS — R930 Abnormal findings on diagnostic imaging of skull and head, not elsewhere classified: Secondary | ICD-10-CM | POA: Insufficient documentation

## 2014-06-19 DIAGNOSIS — F03918 Unspecified dementia, unspecified severity, with other behavioral disturbance: Secondary | ICD-10-CM

## 2014-06-19 MED ORDER — MEMANTINE HCL ER 28 MG PO CP24: 28.0000 mg | ORAL_CAPSULE | Freq: Every day | ORAL | Status: DC
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
## 2014-06-19 NOTE — Addendum Note (Signed)
Addended by: Melvyn NovasHMEIER, Shaima Sardinas on: 06/19/2014 03:00 PM   Modules accepted: Orders

## 2014-06-20 ENCOUNTER — Encounter (HOSPITAL_COMMUNITY): Payer: Self-pay

## 2014-06-22 ENCOUNTER — Other Ambulatory Visit: Payer: Self-pay

## 2014-06-22 ENCOUNTER — Encounter (HOSPITAL_COMMUNITY)
Admission: RE | Admit: 2014-06-22 | Discharge: 2014-06-22 | Disposition: A | Discharge status: Home / Self Care | Payer: Medicare Other | Source: Ambulatory Visit | Attending: Orthopedic Surgery | Admitting: Orthopedic Surgery

## 2014-06-22 ENCOUNTER — Encounter (HOSPITAL_COMMUNITY): Payer: Self-pay

## 2014-06-22 DIAGNOSIS — Z01812 Encounter for preprocedural laboratory examination: Secondary | ICD-10-CM | POA: Diagnosis not present

## 2014-06-22 DIAGNOSIS — M1711 Unilateral primary osteoarthritis, right knee: Secondary | ICD-10-CM | POA: Insufficient documentation

## 2014-06-22 DIAGNOSIS — Z01810 Encounter for preprocedural cardiovascular examination: Secondary | ICD-10-CM | POA: Diagnosis not present

## 2014-06-22 HISTORY — DX: Blindness, one eye, unspecified eye: H54.40

## 2014-06-22 HISTORY — DX: Major depressive disorder, single episode, unspecified: F32.9

## 2014-06-22 HISTORY — DX: Unspecified dementia, unspecified severity, without behavioral disturbance, psychotic disturbance, mood disturbance, and anxiety: F03.90

## 2014-06-22 HISTORY — DX: Depression, unspecified: F32.A

## 2014-06-22 LAB — COMPREHENSIVE METABOLIC PANEL
ALK PHOS: 46 U/L (ref 38–126)
ALT: 19 U/L (ref 17–63)
AST: 20 U/L (ref 15–41)
Albumin: 4.2 g/dL (ref 3.5–5.0)
Anion gap: 8 (ref 5–15)
BUN: 19 mg/dL (ref 6–20)
CALCIUM: 9.6 mg/dL (ref 8.9–10.3)
CHLORIDE: 103 mmol/L (ref 101–111)
CO2: 27 mmol/L (ref 22–32)
CREATININE: 0.88 mg/dL (ref 0.61–1.24)
GFR calc Af Amer: >60 mL/min (ref >60)
GLUCOSE: 93 mg/dL (ref 65–99)
POTASSIUM: 4.2 mmol/L (ref 3.5–5.1)
SODIUM: 138 mmol/L (ref 135–145)
Total Bilirubin: 0.4 mg/dL (ref 0.3–1.2)
Total Protein: 7.5 g/dL (ref 6.5–8.1)

## 2014-06-22 LAB — URINALYSIS, ROUTINE W REFLEX MICROSCOPIC
Bilirubin Urine: NEGATIVE
Glucose, UA: NEGATIVE mg/dL
HGB URINE DIPSTICK: NEGATIVE
KETONES UR: NEGATIVE mg/dL
Leukocytes, UA: NEGATIVE
NITRITE: NEGATIVE
PH: 5.5 (ref 5.0–8.0)
PROTEIN: NEGATIVE mg/dL
SPECIFIC GRAVITY, URINE: 1.02 (ref 1.005–1.030)
Urobilinogen, UA: 0.2 mg/dL (ref 0.0–1.0)

## 2014-06-22 LAB — CBC
HEMATOCRIT: 46.6 % (ref 39.0–52.0)
HEMOGLOBIN: 15.2 g/dL (ref 13.0–17.0)
MCH: 28.7 pg (ref 26.0–34.0)
MCHC: 32.6 g/dL (ref 30.0–36.0)
MCV: 87.9 fL (ref 78.0–100.0)
Platelets: 304 10*3/uL (ref 150–400)
RBC: 5.3 MIL/uL (ref 4.22–5.81)
RDW: 12.9 % (ref 11.5–15.5)
WBC: 7 10*3/uL (ref 4.0–10.5)

## 2014-06-22 LAB — APTT: APTT: 30 s (ref 24–37)

## 2014-06-22 LAB — SURGICAL PCR SCREEN
MRSA, PCR: NEGATIVE
Staphylococcus aureus: NEGATIVE

## 2014-06-22 LAB — PROTIME-INR
INR: 1.06 (ref 0.00–1.49)
PROTHROMBIN TIME: 14 s (ref 11.6–15.2)

## 2014-06-22 NOTE — Progress Notes (Addendum)
Clearance note per chart per Dr Phillips OdorGolding 06/27/2014 pending normal labs  H&P per chart 03/22/2014 / Dr Phillips OdorGolding

## 2014-06-22 NOTE — Patient Instructions (Signed)
Willie Turner  06/22/2014   Your procedure is scheduled on: Wednesday June 27, 2014  [facility] and follow signs to               [facility] arrive at 10:45 AM.  Call this number if you have problems the morning of surgery [phone]   Remember: ONLY 1 PERSON MAY GO WITH YOU TO SHORT STAY TO GET  READY MORNING OF YOUR SURGERY.  Do not eat food  After Midnight but may take clear liquids till 6:45 am day of surgery then nothing by mouth.      Take these medicines the morning of surgery with A SIP OF WATER: Fexofinadine (Allegra); Omeprazole (Prilosec); Albuterol Inhaler if needed (bring with you day of surgery); Advair Inhaler (bring day of surgery)                               You may not have any metal on your body including hair pins and              piercings  Do not wear jewelry, colognes, lotions, powders or  deodorant            Men may shave face and neck.   Do not bring valuables to the hospital. [facility] IS NOT             RESPONSIBLE   FOR VALUABLES.  Contacts, dentures or bridgework may not be worn into surgery.  Leave suitcase in the car. After surgery it may be brought to your room.                  Please read over the following fact sheets you were given:MRSA INFORMATION SHEET; INCENTIVE SPIROMETRY; BLOOD TRANSFUSION FACTS SHEET  _____________________________________________________________________             [facility] Before surgery, you can play an important role.  Because skin is not sterile, your skin needs to be as free of germs as possible.  You can reduce the number of germs on your skin by washing with CHG (chlorahexidine gluconate) soap before surgery.  CHG is an antiseptic cleaner which kills germs and bonds with the skin to continue killing germs even after washing. Please DO NOT use if you have an allergy to CHG or antibacterial soaps.  If your skin becomes  reddened/irritated stop using the CHG and inform your nurse when you arrive at Short Stay. Do not shave (including legs and underarms) for at least 48 hours prior to the first CHG shower.  You may shave your face/neck. Please follow these instructions carefully:  1.  Shower with CHG Soap the night before surgery and the  morning of Surgery.  2.  If you choose to wash your hair, wash your hair first as usual with your  normal  shampoo.  3.  After you shampoo, rinse your hair and body thoroughly to remove the  shampoo.                           4.  Use CHG as you would any other liquid soap.  You can apply chg directly  to the skin and wash  Gently with a scrungie or clean washcloth.  5.  Apply the CHG Soap to your body ONLY FROM THE NECK DOWN.   Do not use on face/ open                           Wound or open sores. Avoid contact with eyes, ears mouth and genitals (private parts).                       Wash face,  Genitals (private parts) with your normal soap.             6.  Wash thoroughly, paying special attention to the area where your surgery  will be performed.  7.  Thoroughly rinse your body with warm water from the neck down.  8.  DO NOT shower/wash with your normal soap after using and rinsing off  the CHG Soap.                9.  Pat yourself dry with a clean towel.            10.  Wear clean pajamas.            11.  Place clean sheets on your bed the night of your first shower and do not  sleep with pets. Day of Surgery : Do not apply any lotions/deodorants the morning of surgery.  Please wear clean clothes to the hospital/surgery center.  FAILURE TO FOLLOW THESE INSTRUCTIONS MAY RESULT IN THE CANCELLATION OF YOUR SURGERY PATIENT SIGNATURE_________________________________  NURSE SIGNATURE__________________________________  ________________________________________________________________________    CLEAR LIQUID DIET   Foods Allowed                                                                      Foods Excluded  Coffee and tea, regular and decaf                             liquids that you cannot  Plain Jell-O in any flavor                                             see through such as: Fruit ices (not with fruit pulp)                                     milk, soups, orange juice  Iced Popsicles                                    All solid food Carbonated beverages, regular and diet                                    Cranberry, grape and apple juices Sports drinks like Gatorade Lightly seasoned clear broth or consume(fat free) Sugar, honey syrup  Sample Menu Breakfast                                Lunch                                     Supper Cranberry juice                    Beef broth                            Chicken broth Jell-O                                     Grape juice                           Apple juice Coffee or tea                        Jell-O                                      Popsicle                                                Coffee or tea                        Coffee or tea  _____________________________________________________________________    Incentive Spirometer  An incentive spirometer is a tool that can help keep your lungs clear and active. This tool measures how well you are filling your lungs with each breath. Taking long deep breaths may help reverse or decrease the chance of developing breathing (pulmonary) problems (especially infection) following:  A long period of time when you are unable to move or be active. BEFORE THE PROCEDURE   If the spirometer includes an indicator to show your best effort, your nurse or respiratory therapist will set it to a desired goal.  If possible, sit up straight or lean slightly forward. Try not to slouch.  Hold the incentive spirometer in an upright position. INSTRUCTIONS FOR USE   Sit on the edge of your bed if possible, or sit up as far as you  can in bed or on a chair.  Hold the incentive spirometer in an upright position.  Breathe out normally.  Place the mouthpiece in your mouth and seal your lips tightly around it.  Breathe in slowly and as deeply as possible, raising the piston or the ball toward the top of the column.  Hold your breath for 3-5 seconds or for as long as possible. Allow the piston or ball to fall to the bottom of the column.  Remove the mouthpiece from your mouth and breathe out normally.  Rest for a few seconds and repeat Steps 1 through 7 at least 10 times every 1-2 hours when you are awake. Take your time and take a few normal breaths between  deep breaths.  The spirometer may include an indicator to show your best effort. Use the indicator as a goal to work toward during each repetition.  After each set of 10 deep breaths, practice coughing to be sure your lungs are clear. If you have an incision (the cut made at the time of surgery), support your incision when coughing by placing a pillow or rolled up towels firmly against it. Once you are able to get out of bed, walk around indoors and cough well. You may stop using the incentive spirometer when instructed by your caregiver.  RISKS AND COMPLICATIONS  Take your time so you do not get dizzy or light-headed.  If you are in pain, you may need to take or ask for pain medication before doing incentive spirometry. It is harder to take a deep breath if you are having pain. AFTER USE  Rest and breathe slowly and easily.  It can be helpful to keep track of a log of your progress. Your caregiver can provide you with a simple table to help with this. If you are using the spirometer at home, follow these instructions: [place] IF:   You are having difficultly using the spirometer.  You have trouble using the spirometer as often as instructed.  Your pain medication is not giving enough relief while using the spirometer.  You develop fever of  100.5 F (38.1 C) or higher. SEEK IMMEDIATE MEDICAL CARE IF:   You cough up bloody sputum that had not been present before.  You develop fever of 102 F (38.9 C) or greater.  You develop worsening pain at or near the incision site. MAKE SURE YOU:   Understand these instructions.  Will watch your condition.  Will get help right away if you are not doing well or get worse. Document Released: 05/18/2006 Document Revised: 03/30/2011 Document Reviewed: 07/19/2006 Exit[employer] Patient Information 2014 [employer], [place].   ________________________________________________________________________  WHAT IS A BLOOD TRANSFUSION? Blood Transfusion Information  A transfusion is the replacement of blood or some of its parts. Blood is made up of multiple cells which provide different functions.  Red blood cells carry oxygen and are used for blood loss replacement.  White blood cells fight against infection.  Platelets control bleeding.  Plasma helps clot blood.  Other blood products are available for specialized needs, such as hemophilia or other clotting disorders. BEFORE THE TRANSFUSION  Who gives blood for transfusions?   Healthy volunteers who are fully evaluated to make sure their blood is safe. This is blood bank blood. Transfusion therapy is the safest it has ever been in the practice of medicine. Before blood is taken from a donor, a complete history is taken to make sure that person has no history of diseases nor engages in risky social behavior (examples are intravenous drug use or sexual activity with multiple partners). The donor's travel history is screened to minimize risk of transmitting infections, such as malaria. The donated blood is tested for signs of infectious diseases, such as HIV and hepatitis. The blood is then tested to be sure it is compatible with you in order to minimize the chance of a transfusion reaction. If you or a relative donates blood, this is often done in  anticipation of surgery and is not appropriate for emergency situations. It takes many days to process the donated blood. RISKS AND COMPLICATIONS Although transfusion therapy is very safe and saves many lives, the main dangers of transfusion include:   Getting an infectious disease.  Developing a transfusion reaction. This is an allergic reaction to something in the blood you were given. Every precaution is taken to prevent this. The decision to have a blood transfusion has been considered carefully by your caregiver before blood is given. Blood is not given unless the benefits outweigh the risks. AFTER THE TRANSFUSION  Right after receiving a blood transfusion, you will usually feel much better and more energetic. This is especially true if your red blood cells have gotten low (anemic). The transfusion raises the level of the red blood cells which carry oxygen, and this usually causes an energy increase.  The nurse administering the transfusion will monitor you carefully for complications. HOME CARE INSTRUCTIONS  No special instructions are needed after a transfusion. You may find your energy is better. Speak with your caregiver about any limitations on activity for underlying diseases you may have. SEEK MEDICAL CARE IF:   Your condition is not improving after your transfusion.  You develop redness or irritation at the intravenous (IV) site. SEEK IMMEDIATE MEDICAL CARE IF:  Any of the following symptoms occur over the next 12 hours:  Shaking chills.  You have a temperature by mouth above 102 F (38.9 C), not controlled by medicine.  Chest, back, or muscle pain.  People around you feel you are not acting correctly or are confused.  Shortness of breath or difficulty breathing.  Dizziness and fainting.  You get a rash or develop hives.  You have a decrease in urine output.  Your urine turns a dark color or changes to pink, red, or brown. Any of the following symptoms occur over  the next 10 days:  You have a temperature by mouth above 102 F (38.9 C), not controlled by medicine.  Shortness of breath.  Weakness after normal activity.  The white part of the eye turns yellow (jaundice).  You have a decrease in the amount of urine or are urinating less often.  Your urine turns a dark color or changes to pink, red, or brown. Document Released: 01/03/2000 Document Revised: 03/30/2011 Document Reviewed: 08/22/2007 [facility] Patient Information 2014 [place], [place].  _______________________________________________________________________

## 2014-06-26 ENCOUNTER — Ambulatory Visit: Payer: Self-pay | Admitting: Orthopedic Surgery

## 2014-06-27 ENCOUNTER — Inpatient Hospital Stay (HOSPITAL_COMMUNITY): Payer: Medicare Other | Admitting: Anesthesiology

## 2014-06-27 ENCOUNTER — Encounter (HOSPITAL_COMMUNITY): Payer: Self-pay | Admitting: *Deleted

## 2014-06-27 ENCOUNTER — Inpatient Hospital Stay (HOSPITAL_COMMUNITY)
Admission: RE | Admit: 2014-06-27 | Discharge: 2014-06-30 | DRG: 470 | Disposition: A | Discharge status: Skilled Nursing Facility | Payer: Medicare Other | Source: Ambulatory Visit | Attending: Orthopedic Surgery | Admitting: Orthopedic Surgery

## 2014-06-27 ENCOUNTER — Ambulatory Visit: Payer: Self-pay | Admitting: Orthopedic Surgery

## 2014-06-27 ENCOUNTER — Encounter (HOSPITAL_COMMUNITY)
Admission: RE | Disposition: A | Discharge status: Skilled Nursing Facility | Payer: Self-pay | Source: Ambulatory Visit | Attending: Orthopedic Surgery

## 2014-06-27 DIAGNOSIS — K219 Gastro-esophageal reflux disease without esophagitis: Secondary | ICD-10-CM | POA: Diagnosis present

## 2014-06-27 DIAGNOSIS — Z79899 Other long term (current) drug therapy: Secondary | ICD-10-CM

## 2014-06-27 DIAGNOSIS — H5441 Blindness, right eye, normal vision left eye: Secondary | ICD-10-CM | POA: Diagnosis present

## 2014-06-27 DIAGNOSIS — F329 Major depressive disorder, single episode, unspecified: Secondary | ICD-10-CM | POA: Diagnosis present

## 2014-06-27 DIAGNOSIS — Z87891 Personal history of nicotine dependence: Secondary | ICD-10-CM

## 2014-06-27 DIAGNOSIS — M179 Osteoarthritis of knee, unspecified: Secondary | ICD-10-CM | POA: Diagnosis not present

## 2014-06-27 DIAGNOSIS — M1712 Unilateral primary osteoarthritis, left knee: Secondary | ICD-10-CM

## 2014-06-27 DIAGNOSIS — M25562 Pain in left knee: Secondary | ICD-10-CM | POA: Diagnosis not present

## 2014-06-27 DIAGNOSIS — M1990 Unspecified osteoarthritis, unspecified site: Secondary | ICD-10-CM | POA: Diagnosis not present

## 2014-06-27 DIAGNOSIS — I10 Essential (primary) hypertension: Secondary | ICD-10-CM | POA: Diagnosis present

## 2014-06-27 DIAGNOSIS — G309 Alzheimer's disease, unspecified: Secondary | ICD-10-CM | POA: Diagnosis present

## 2014-06-27 DIAGNOSIS — J449 Chronic obstructive pulmonary disease, unspecified: Secondary | ICD-10-CM | POA: Diagnosis not present

## 2014-06-27 DIAGNOSIS — F419 Anxiety disorder, unspecified: Secondary | ICD-10-CM | POA: Diagnosis present

## 2014-06-27 DIAGNOSIS — Z01812 Encounter for preprocedural laboratory examination: Secondary | ICD-10-CM | POA: Diagnosis not present

## 2014-06-27 DIAGNOSIS — E785 Hyperlipidemia, unspecified: Secondary | ICD-10-CM | POA: Diagnosis present

## 2014-06-27 DIAGNOSIS — M1711 Unilateral primary osteoarthritis, right knee: Secondary | ICD-10-CM | POA: Diagnosis not present

## 2014-06-27 DIAGNOSIS — J45909 Unspecified asthma, uncomplicated: Secondary | ICD-10-CM | POA: Diagnosis present

## 2014-06-27 DIAGNOSIS — M1710 Unilateral primary osteoarthritis, unspecified knee: Secondary | ICD-10-CM | POA: Diagnosis present

## 2014-06-27 HISTORY — PX: TOTAL KNEE ARTHROPLASTY: SHX125

## 2014-06-27 LAB — TYPE AND SCREEN
ABO/RH(D): A POS
Antibody Screen: POSITIVE
DAT, IGG: NEGATIVE
PT AG Type: NEGATIVE

## 2014-06-27 SURGERY — ARTHROPLASTY, KNEE, TOTAL
Anesthesia: Monitor Anesthesia Care | Site: Knee | Laterality: Left

## 2014-06-27 MED ORDER — DEXTROSE 5 % IV SOLN
500.0000 mg | Freq: Four times a day (QID) | INTRAVENOUS | Status: DC | PRN
  Administered 2014-06-27: 500 mg via INTRAVENOUS
  Filled 2014-06-27 (×2): qty 5

## 2014-06-27 MED ORDER — DEXAMETHASONE SODIUM PHOSPHATE 10 MG/ML IJ SOLN
10.0000 mg | Freq: Once | INTRAMUSCULAR | Status: AC
  Administered 2014-06-27: 10 mg via INTRAVENOUS

## 2014-06-27 MED ORDER — METOCLOPRAMIDE HCL 10 MG PO TABS
5.0000 mg | ORAL_TABLET | Freq: Three times a day (TID) | ORAL | Status: DC | PRN
Start: 2014-06-27 — End: 2014-06-30

## 2014-06-27 MED ORDER — BUPIVACAINE HCL 0.25 % IJ SOLN
INTRAMUSCULAR | Status: DC | PRN
  Administered 2014-06-27: 30 mL

## 2014-06-27 MED ORDER — ONDANSETRON HCL 4 MG/2ML IJ SOLN: 4.0000 mg | Freq: Four times a day (QID) | INTRAMUSCULAR | Status: DC | PRN

## 2014-06-27 MED ORDER — MENTHOL 3 MG MT LOZG: 1.0000 | LOZENGE | OROMUCOSAL | Status: DC | PRN

## 2014-06-27 MED ORDER — PHENOL 1.4 % MT LIQD: 1.0000 | OROMUCOSAL | Status: DC | PRN

## 2014-06-27 MED ORDER — MEMANTINE HCL ER 28 MG PO CP24
28.0000 mg | ORAL_CAPSULE | Freq: Every day | ORAL | Status: DC
  Administered 2014-06-27 – 2014-06-29 (×3): 28 mg via ORAL
  Filled 2014-06-27 (×4): qty 1

## 2014-06-27 MED ORDER — SODIUM CHLORIDE 0.9 % IJ SOLN
INTRAMUSCULAR | Status: DC | PRN
  Administered 2014-06-27: 30 mL

## 2014-06-27 MED ORDER — LACTATED RINGERS IV SOLN
INTRAVENOUS | Status: DC
  Administered 2014-06-27: 13:00:00 via INTRAVENOUS
  Administered 2014-06-27: 1000 mL via INTRAVENOUS
  Administered 2014-06-27: 12:00:00 via INTRAVENOUS

## 2014-06-27 MED ORDER — LORATADINE 10 MG PO TABS
10.0000 mg | ORAL_TABLET | Freq: Every day | ORAL | Status: DC
Start: 2014-06-28 — End: 2014-06-27

## 2014-06-27 MED ORDER — OXYCODONE HCL 5 MG PO TABS: 5.0000 mg | ORAL_TABLET | Freq: Once | ORAL | Status: DC | PRN

## 2014-06-27 MED ORDER — DEXTROSE-NACL 5-0.9 % IV SOLN
INTRAVENOUS | Status: DC
  Administered 2014-06-27: 19:00:00 via INTRAVENOUS

## 2014-06-27 MED ORDER — DEXAMETHASONE SODIUM PHOSPHATE 10 MG/ML IJ SOLN
10.0000 mg | Freq: Once | INTRAMUSCULAR | Status: AC
  Administered 2014-06-28: 10 mg via INTRAVENOUS
  Filled 2014-06-27: qty 1

## 2014-06-27 MED ORDER — POLYETHYLENE GLYCOL 3350 17 G PO PACK
17.0000 g | PACK | Freq: Every day | ORAL | Status: DC | PRN
  Administered 2014-06-29: 17 g via ORAL
  Filled 2014-06-27: qty 1

## 2014-06-27 MED ORDER — ONDANSETRON HCL 4 MG PO TABS: 4.0000 mg | ORAL_TABLET | Freq: Four times a day (QID) | ORAL | Status: DC | PRN

## 2014-06-27 MED ORDER — DIPHENHYDRAMINE HCL 12.5 MG/5ML PO ELIX: 12.5000 mg | ORAL_SOLUTION | ORAL | Status: DC | PRN

## 2014-06-27 MED ORDER — ACETAMINOPHEN 325 MG PO TABS: 325.0000 mg | ORAL_TABLET | ORAL | Status: DC | PRN

## 2014-06-27 MED ORDER — PANTOPRAZOLE SODIUM 40 MG PO TBEC
40.0000 mg | DELAYED_RELEASE_TABLET | Freq: Every day | ORAL | Status: DC
  Filled 2014-06-27: qty 1

## 2014-06-27 MED ORDER — SODIUM CHLORIDE 0.9 % IV SOLN: INTRAVENOUS | Status: DC

## 2014-06-27 MED ORDER — ACETAMINOPHEN 650 MG RE SUPP: 650.0000 mg | Freq: Four times a day (QID) | RECTAL | Status: DC | PRN

## 2014-06-27 MED ORDER — MOMETASONE FURO-FORMOTEROL FUM 100-5 MCG/ACT IN AERO: 2.0000 | INHALATION_SPRAY | Freq: Two times a day (BID) | RESPIRATORY_TRACT | Status: DC

## 2014-06-27 MED ORDER — BUPIVACAINE LIPOSOME 1.3 % IJ SUSP
20.0000 mL | Freq: Once | INTRAMUSCULAR | Status: DC
  Filled 2014-06-27: qty 20

## 2014-06-27 MED ORDER — CEFAZOLIN SODIUM-DEXTROSE 2-3 GM-% IV SOLR
2.0000 g | INTRAVENOUS | Status: AC
  Administered 2014-06-27: 2 g via INTRAVENOUS

## 2014-06-27 MED ORDER — CHLORHEXIDINE GLUCONATE 4 % EX LIQD: 60.0000 mL | Freq: Once | CUTANEOUS | Status: DC

## 2014-06-27 MED ORDER — PROPOFOL 10 MG/ML IV BOLUS
INTRAVENOUS | Status: AC
Start: 2014-06-27 — End: 2014-06-27
  Filled 2014-06-27: qty 20

## 2014-06-27 MED ORDER — EPHEDRINE SULFATE 50 MG/ML IJ SOLN
INTRAMUSCULAR | Status: AC
  Filled 2014-06-27: qty 1

## 2014-06-27 MED ORDER — 0.9 % SODIUM CHLORIDE (POUR BTL) OPTIME
TOPICAL | Status: DC | PRN
  Administered 2014-06-27: 1000 mL

## 2014-06-27 MED ORDER — FLEET ENEMA 7-19 GM/118ML RE ENEM: 1.0000 | ENEMA | Freq: Once | RECTAL | Status: AC | PRN

## 2014-06-27 MED ORDER — DOCUSATE SODIUM 100 MG PO CAPS
100.0000 mg | ORAL_CAPSULE | Freq: Two times a day (BID) | ORAL | Status: DC
  Administered 2014-06-27 – 2014-06-30 (×5): 100 mg via ORAL

## 2014-06-27 MED ORDER — LIDOCAINE HCL (CARDIAC) 20 MG/ML IV SOLN
INTRAVENOUS | Status: AC
  Filled 2014-06-27: qty 5

## 2014-06-27 MED ORDER — ONDANSETRON HCL 4 MG/2ML IJ SOLN
INTRAMUSCULAR | Status: DC | PRN
  Administered 2014-06-27: 4 mg via INTRAVENOUS

## 2014-06-27 MED ORDER — BUPIVACAINE IN DEXTROSE 0.75-8.25 % IT SOLN
INTRATHECAL | Status: DC | PRN
  Administered 2014-06-27: 2 mL via INTRATHECAL

## 2014-06-27 MED ORDER — ACETAMINOPHEN 500 MG PO TABS
1000.0000 mg | ORAL_TABLET | Freq: Four times a day (QID) | ORAL | Status: AC
  Administered 2014-06-27 – 2014-06-28 (×3): 1000 mg via ORAL
  Filled 2014-06-27 (×3): qty 2

## 2014-06-27 MED ORDER — PROPOFOL 500 MG/50ML IV EMUL
INTRAVENOUS | Status: DC | PRN
  Administered 2014-06-27: 30 mg via INTRAVENOUS

## 2014-06-27 MED ORDER — DEXAMETHASONE SODIUM PHOSPHATE 10 MG/ML IJ SOLN
INTRAMUSCULAR | Status: AC
  Filled 2014-06-27: qty 1

## 2014-06-27 MED ORDER — BISACODYL 10 MG RE SUPP: 10.0000 mg | Freq: Every day | RECTAL | Status: DC | PRN

## 2014-06-27 MED ORDER — MORPHINE SULFATE 2 MG/ML IJ SOLN: 1.0000 mg | INTRAMUSCULAR | Status: DC | PRN

## 2014-06-27 MED ORDER — TRANEXAMIC ACID 1000 MG/10ML IV SOLN
1000.0000 mg | INTRAVENOUS | Status: AC
  Administered 2014-06-27: 1000 mg via INTRAVENOUS
  Filled 2014-06-27: qty 10

## 2014-06-27 MED ORDER — ALBUTEROL SULFATE (2.5 MG/3ML) 0.083% IN NEBU: 2.5000 mg | INHALATION_SOLUTION | RESPIRATORY_TRACT | Status: DC | PRN

## 2014-06-27 MED ORDER — OXYCODONE HCL 5 MG/5ML PO SOLN
5.0000 mg | Freq: Once | ORAL | Status: DC | PRN
  Filled 2014-06-27: qty 5

## 2014-06-27 MED ORDER — SODIUM CHLORIDE 0.9 % IJ SOLN
INTRAMUSCULAR | Status: AC
Start: 2014-06-27 — End: 2014-06-27
  Filled 2014-06-27: qty 10

## 2014-06-27 MED ORDER — ACETAMINOPHEN 160 MG/5ML PO SOLN
325.0000 mg | ORAL | Status: DC | PRN
  Filled 2014-06-27: qty 20.3

## 2014-06-27 MED ORDER — PROPOFOL 10 MG/ML IV BOLUS
INTRAVENOUS | Status: AC
  Filled 2014-06-27: qty 20

## 2014-06-27 MED ORDER — ONDANSETRON HCL 4 MG/2ML IJ SOLN
INTRAMUSCULAR | Status: AC
Start: 2014-06-27 — End: 2014-06-27
  Filled 2014-06-27: qty 2

## 2014-06-27 MED ORDER — ACETAMINOPHEN 10 MG/ML IV SOLN
1000.0000 mg | Freq: Once | INTRAVENOUS | Status: AC
  Administered 2014-06-27: 1000 mg via INTRAVENOUS
  Filled 2014-06-27: qty 100

## 2014-06-27 MED ORDER — METOCLOPRAMIDE HCL 5 MG/ML IJ SOLN: 5.0000 mg | Freq: Three times a day (TID) | INTRAMUSCULAR | Status: DC | PRN

## 2014-06-27 MED ORDER — BUPIVACAINE LIPOSOME 1.3 % IJ SUSP
INTRAMUSCULAR | Status: DC | PRN
  Administered 2014-06-27: 20 mL

## 2014-06-27 MED ORDER — KETOROLAC TROMETHAMINE 15 MG/ML IJ SOLN: 7.5000 mg | Freq: Four times a day (QID) | INTRAMUSCULAR | Status: AC | PRN

## 2014-06-27 MED ORDER — CEFAZOLIN SODIUM-DEXTROSE 2-3 GM-% IV SOLR
2.0000 g | Freq: Four times a day (QID) | INTRAVENOUS | Status: AC
  Administered 2014-06-27 (×2): 2 g via INTRAVENOUS
  Filled 2014-06-27 (×2): qty 50

## 2014-06-27 MED ORDER — ACETAMINOPHEN 325 MG PO TABS: 650.0000 mg | ORAL_TABLET | Freq: Four times a day (QID) | ORAL | Status: DC | PRN

## 2014-06-27 MED ORDER — SODIUM CHLORIDE 0.9 % IJ SOLN
INTRAMUSCULAR | Status: AC
Start: 2014-06-27 — End: 2014-06-27
  Filled 2014-06-27: qty 50

## 2014-06-27 MED ORDER — METHOCARBAMOL 500 MG PO TABS
500.0000 mg | ORAL_TABLET | Freq: Four times a day (QID) | ORAL | Status: DC | PRN
  Administered 2014-06-27 – 2014-06-30 (×5): 500 mg via ORAL
  Filled 2014-06-27 (×4): qty 1

## 2014-06-27 MED ORDER — ACETAMINOPHEN 10 MG/ML IV SOLN
INTRAVENOUS | Status: AC
  Filled 2014-06-27: qty 100

## 2014-06-27 MED ORDER — RIVAROXABAN 10 MG PO TABS
10.0000 mg | ORAL_TABLET | Freq: Every day | ORAL | Status: DC
  Administered 2014-06-28 – 2014-06-30 (×3): 10 mg via ORAL
  Filled 2014-06-27 (×5): qty 1

## 2014-06-27 MED ORDER — STERILE WATER FOR IRRIGATION IR SOLN
Status: DC | PRN
  Administered 2014-06-27: 1500 mL

## 2014-06-27 MED ORDER — PROPOFOL INFUSION 10 MG/ML OPTIME
INTRAVENOUS | Status: DC | PRN
  Administered 2014-06-27: 100 ug/kg/min via INTRAVENOUS

## 2014-06-27 MED ORDER — OXYCODONE HCL 5 MG PO TABS
5.0000 mg | ORAL_TABLET | ORAL | Status: DC | PRN
  Administered 2014-06-27 – 2014-06-30 (×14): 10 mg via ORAL
  Filled 2014-06-27 (×15): qty 2

## 2014-06-27 MED ORDER — SODIUM CHLORIDE 0.9 % IR SOLN
Status: DC | PRN
  Administered 2014-06-27: 1000 mL

## 2014-06-27 MED ORDER — BUPIVACAINE HCL (PF) 0.25 % IJ SOLN
INTRAMUSCULAR | Status: AC
  Filled 2014-06-27: qty 30

## 2014-06-27 MED ORDER — CEFAZOLIN SODIUM-DEXTROSE 2-3 GM-% IV SOLR
INTRAVENOUS | Status: AC
  Filled 2014-06-27: qty 50

## 2014-06-27 MED ORDER — HYDROMORPHONE HCL 1 MG/ML IJ SOLN: 0.2500 mg | INTRAMUSCULAR | Status: DC | PRN

## 2014-06-27 MED ORDER — TRAMADOL HCL 50 MG PO TABS: 50.0000 mg | ORAL_TABLET | Freq: Four times a day (QID) | ORAL | Status: DC | PRN

## 2014-06-27 SURGICAL SUPPLY — 63 items
BAG DECANTER FOR FLEXI CONT (MISCELLANEOUS) ×3 IMPLANT
BAG ZIPLOCK 12X15 (MISCELLANEOUS) ×3 IMPLANT
BANDAGE ELASTIC 6 VELCRO ST LF (GAUZE/BANDAGES/DRESSINGS) ×3 IMPLANT
BANDAGE ESMARK 6X9 LF (GAUZE/BANDAGES/DRESSINGS) ×1 IMPLANT
BLADE SAG 18X100X1.27 (BLADE) ×3 IMPLANT
BLADE SAW SGTL 11.0X1.19X90.0M (BLADE) ×3 IMPLANT
BNDG CONFORM 3 STRL LF (GAUZE/BANDAGES/DRESSINGS) ×3 IMPLANT
BNDG CONFORM 6X.82 1P STRL (GAUZE/BANDAGES/DRESSINGS) ×3 IMPLANT
BNDG ESMARK 6X9 LF (GAUZE/BANDAGES/DRESSINGS) ×3
BOWL SMART MIX CTS (DISPOSABLE) ×3 IMPLANT
CAP KNEE TOTAL 3 SIGMA ×3 IMPLANT
CEMENT HV SMART SET (Cement) ×6 IMPLANT
CLOSURE WOUND 1/2 X4 (GAUZE/BANDAGES/DRESSINGS) ×2
CUFF TOURN SGL QUICK 34 (TOURNIQUET CUFF) ×2
CUFF TRNQT CYL 34X4X40X1 (TOURNIQUET CUFF) ×1 IMPLANT
DECANTER SPIKE VIAL GLASS SM (MISCELLANEOUS) ×3 IMPLANT
DRAPE EXTREMITY T 121X128X90 (DRAPE) ×3 IMPLANT
DRAPE POUCH INSTRU U-SHP 10X18 (DRAPES) ×3 IMPLANT
DRAPE U-SHAPE 47X51 STRL (DRAPES) ×3 IMPLANT
DRSG ADAPTIC 3X8 NADH LF (GAUZE/BANDAGES/DRESSINGS) ×3 IMPLANT
DRSG PAD ABDOMINAL 8X10 ST (GAUZE/BANDAGES/DRESSINGS) ×3 IMPLANT
DURAPREP 26ML APPLICATOR (WOUND CARE) ×3 IMPLANT
ELECT REM PT RETURN 9FT ADLT (ELECTROSURGICAL) ×3
ELECTRODE REM PT RTRN 9FT ADLT (ELECTROSURGICAL) ×1 IMPLANT
EVACUATOR 1/8 PVC DRAIN (DRAIN) ×3 IMPLANT
FACESHIELD WRAPAROUND (MASK) ×15 IMPLANT
GAUZE SPONGE 4X4 12PLY STRL (GAUZE/BANDAGES/DRESSINGS) ×3 IMPLANT
GLOVE BIO SURGEON STRL SZ7.5 (GLOVE) IMPLANT
GLOVE BIO SURGEON STRL SZ8 (GLOVE) ×3 IMPLANT
GLOVE BIOGEL PI IND STRL 6.5 (GLOVE) IMPLANT
GLOVE BIOGEL PI IND STRL 8 (GLOVE) ×1 IMPLANT
GLOVE BIOGEL PI INDICATOR 6.5 (GLOVE)
GLOVE BIOGEL PI INDICATOR 8 (GLOVE) ×2
GLOVE SURG SS PI 6.5 STRL IVOR (GLOVE) IMPLANT
GOWN STRL REUS W/TWL LRG LVL3 (GOWN DISPOSABLE) ×3 IMPLANT
GOWN STRL REUS W/TWL XL LVL3 (GOWN DISPOSABLE) IMPLANT
HANDPIECE INTERPULSE COAX TIP (DISPOSABLE) ×2
IMMOBILIZER KNEE 20 (SOFTGOODS) ×3
IMMOBILIZER KNEE 20 THIGH 36 (SOFTGOODS) ×1 IMPLANT
KIT BASIN OR (CUSTOM PROCEDURE TRAY) ×3 IMPLANT
MANIFOLD NEPTUNE II (INSTRUMENTS) ×3 IMPLANT
NDL SAFETY ECLIPSE 18X1.5 (NEEDLE) ×2 IMPLANT
NEEDLE HYPO 18GX1.5 SHARP (NEEDLE) ×4
NS IRRIG 1000ML POUR BTL (IV SOLUTION) ×3 IMPLANT
PACK TOTAL JOINT (CUSTOM PROCEDURE TRAY) ×3 IMPLANT
PADDING CAST COTTON 6X4 STRL (CAST SUPPLIES) ×9 IMPLANT
PEN SKIN MARKING BROAD (MISCELLANEOUS) ×3 IMPLANT
POSITIONER SURGICAL ARM (MISCELLANEOUS) ×3 IMPLANT
SET HNDPC FAN SPRY TIP SCT (DISPOSABLE) ×1 IMPLANT
STRIP CLOSURE SKIN 1/2X4 (GAUZE/BANDAGES/DRESSINGS) ×4 IMPLANT
SUCTION FRAZIER 12FR DISP (SUCTIONS) ×3 IMPLANT
SUT MNCRL AB 4-0 PS2 18 (SUTURE) ×3 IMPLANT
SUT VIC AB 2-0 CT1 27 (SUTURE) ×6
SUT VIC AB 2-0 CT1 TAPERPNT 27 (SUTURE) ×3 IMPLANT
SUT VLOC 180 0 24IN GS25 (SUTURE) ×3 IMPLANT
SYR 20CC LL (SYRINGE) ×3 IMPLANT
SYR 50ML LL SCALE MARK (SYRINGE) ×3 IMPLANT
TOWEL OR 17X26 10 PK STRL BLUE (TOWEL DISPOSABLE) ×3 IMPLANT
TOWEL OR NON WOVEN STRL DISP B (DISPOSABLE) IMPLANT
TRAY FOLEY W/METER SILVER 14FR (SET/KITS/TRAYS/PACK) ×3 IMPLANT
WATER STERILE IRR 1500ML POUR (IV SOLUTION) ×3 IMPLANT
WRAP KNEE MAXI GEL POST OP (GAUZE/BANDAGES/DRESSINGS) ×3 IMPLANT
YANKAUER SUCT BULB TIP 10FT TU (MISCELLANEOUS) ×3 IMPLANT

## 2014-06-27 NOTE — Op Note (Signed)
Pre-operative diagnosis- Osteoarthritis  Left knee(s)  Post-operative diagnosis- Osteoarthritis Left knee(s)  Procedure-  Left  Total Knee Arthroplasty  Surgeon- Willie RankinFrank V. Bridger Pizzi, MD  Assistant- Willie Peacerew Perkins, PA-C   Anesthesia-  Spinal  EBL-* No blood loss amount entered *   Drains Hemovac  Tourniquet time-  Total Tourniquet Time Documented: Thigh (Left) - 34 minutes Total: Thigh (Left) - 34 minutes     Complications- None  Condition-PACU - hemodynamically stable.   Brief Clinical Note   Willie Turner is a 74 y.o. 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 He has constant pain, with activity and at rest and significant functional deficits with difficulties even with ADLs. He has had extensive non-op management including analgesics, injections of cortisone and home exercise program, but remains in significant pain with significant dysfunction. Radiographs show bone on bone arthritis. He presents now for left Total Knee Arthroplasty.    Procedure in detail---   The patient is brought into the operating room and positioned supine on the operating table. After successful administration of  Spinal,   a tourniquet is placed high on the  Left thigh(s) and the lower extremity is prepped and draped in the usual sterile fashion. Time out is performed by the operating team and then the  Left lower extremity is wrapped in Esmarch, knee flexed and the tourniquet inflated to 300 mmHg.       A midline incision is made with a ten blade through the subcutaneous tissue to the level of the extensor mechanism. A fresh blade is used to make a medial parapatellar arthrotomy. Soft tissue over the proximal medial tibia is subperiosteally elevated to the joint line with a knife and into the semimembranosus bursa with a Cobb elevator. Soft tissue over the proximal lateral tibia is elevated with attention being paid to avoiding the patellar tendon on the  tibial tubercle. The patella is everted, knee flexed 90 degrees and the ACL and PCL are removed. Findings are bone on bone lateral and patellofemoral        The drill is used to create a starting hole in the distal femur and the canal is thoroughly irrigated with sterile saline to remove the fatty contents. The 5 degree Left  valgus alignment guide is placed into the femoral canal and the distal femoral cutting block is pinned to remove 10 mm off the distal femur. Resection is made with an oscillating saw.      The tibia is subluxed forward and the menisci are removed. The extramedullary alignment guide is placed referencing proximally at the medial aspect of the tibial tubercle and distally along the second metatarsal axis and tibial crest. The block is pinned to remove 2mm off the more deficient lateral  side. Resection is made with an oscillating saw. Size 4is the most appropriate size for the tibia and the proximal tibia is prepared with the modular drill and keel punch for that size.      The femoral sizing guide is placed and size 4 is most appropriate. Rotation is marked off the epicondylar axis and confirmed by creating a rectangular flexion gap at 90 degrees. The size 4 cutting block is pinned in this rotation and the anterior, posterior and chamfer cuts are made with the oscillating saw. The intercondylar block is then placed and that cut is made.      Trial size 4 tibial component, trial size 4 posterior stabilized femur and a  12.5  mm posterior stabilized rotating platform insert trial is placed. Full extension is achieved with excellent varus/valgus and anterior/posterior balance throughout full range of motion. The patella is everted and thickness measured to be 24  mm. Free hand resection is taken to 14 mm, a 38 template is placed, lug holes are drilled, trial patella is placed, and it tracks normally. Osteophytes are removed off the posterior femur with the trial in place. All trials are removed  and the cut bone surfaces prepared with pulsatile lavage. Cement is mixed and once ready for implantation, the size 4 tibial implant, size  4 posterior stabilized femoral component, and the size 38 patella are cemented in place and the patella is held with the clamp. The trial insert is placed and the knee held in full extension. The Exparel (20 ml mixed with 30 ml saline) and .25% Bupivicaine, are injected into the extensor mechanism, posterior capsule, medial and lateral gutters and subcutaneous tissues.  All extruded cement is removed and once the cement is hard the permanent 12.5 mm posterior stabilized rotating platform insert is placed into the tibial tray.      The wound is copiously irrigated with saline solution and the extensor mechanism closed over a hemovac drain with #1 V-loc suture. The tourniquet is released for a total tourniquet time of 34  minutes. Flexion against gravity is 140 degrees and the patella tracks normally. Subcutaneous tissue is closed with 2.0 vicryl and subcuticular with running 4.0 Monocryl. The incision is cleaned and dried and steri-strips and a bulky sterile dressing are applied. The limb is placed into a knee immobilizer and the patient is awakened and transported to recovery in stable condition.      Please note that a surgical assistant was a medical necessity for this procedure in order to perform it in a safe and expeditious manner. Surgical assistant was necessary to retract the ligaments and vital neurovascular structures to prevent injury to them and also necessary for proper positioning of the limb to allow for anatomic placement of the prosthesis.   Willie Rankin Willie Kangas, MD    06/27/2014, 10:12 PM
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
## 2014-06-27 NOTE — Anesthesia Preprocedure Evaluation (Signed)
Anesthesia Evaluation  Patient identified by MRN, date of birth, ID band Patient awake    Reviewed: Allergy & Precautions, NPO status , Patient's Chart, lab work & pertinent test results  History of Anesthesia Complications Negative for: history of anesthetic complications  Airway Mallampati: II  TM Distance: >3 FB Neck ROM: Full    Dental  (+) Teeth Intact,    Pulmonary shortness of breath and with exertion, neg sleep apnea, COPD COPD inhaler, neg recent URI, former smoker, neg PE breath sounds clear to auscultation        Cardiovascular hypertension, - angina- Past MI and - CHF - dysrhythmias Rhythm:Regular     Neuro/Psych PSYCHIATRIC DISORDERS Depression negative neurological ROS     GI/Hepatic Neg liver ROS, GERD-  Controlled and Medicated,  Endo/Other  negative endocrine ROS  Renal/GU negative Renal ROS     Musculoskeletal  (+) Arthritis -,   Abdominal   Peds  Hematology negative hematology ROS (+)   Anesthesia Other Findings   Reproductive/Obstetrics                             Anesthesia Physical Anesthesia Plan  ASA: II  Anesthesia Plan: MAC and Spinal   Post-op Pain Management:    Induction: Intravenous  Airway Management Planned: Nasal Cannula  Additional Equipment: None  Intra-op Plan:   Post-operative Plan:   Informed Consent: I have reviewed the patients History and Physical, chart, labs and discussed the procedure including the risks, benefits and alternatives for the proposed anesthesia with the patient or authorized representative who has indicated his/her understanding and acceptance.   Dental advisory given  Plan Discussed with: CRNA and Surgeon  Anesthesia Plan Comments:         Anesthesia Quick Evaluation

## 2014-06-27 NOTE — Interval H&P Note (Signed)
History and Physical Interval Note:  06/27/2014 12:26 PM  Willie MayhewJohn W Turner  has presented today for surgery, with the diagnosis of OA LEFT KNEE  The various methods of treatment have been discussed with the patient and family. After consideration of risks, benefits and other options for treatment, the patient has consented to  Procedure(s): LEFT TOTAL KNEE ARTHROPLASTY (Left) as a surgical intervention .  The patient's history has been reviewed, patient examined, no change in status, stable for surgery.  I have reviewed the patient's chart and labs.  Questions were answered to the patient's satisfaction.     Loanne DrillingALUISIO,Phoebe Marter V
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
## 2014-06-27 NOTE — Anesthesia Procedure Notes (Signed)
Spinal Patient location during procedure: OR Start time: 06/27/2014 12:32 PM End time: 06/27/2014 12:37 PM Staffing Resident/CRNA: Voncile Schwarz G Performed by: resident/CRNA  Preanesthetic Checklist Completed: patient identified, site marked, surgical consent, pre-op evaluation, timeout performed, IV checked, risks and benefits discussed and monitors and equipment checked Spinal Block Patient position: sitting Prep: Betadine Patient monitoring: heart rate, cardiac monitor, continuous pulse ox and blood pressure Approach: midline Location: L2-3 Needle Needle gauge: 25 G Needle length: 10 cm Needle insertion depth: 5 cm Assessment Sensory level: T8

## 2014-06-27 NOTE — Transfer of Care (Signed)
Immediate Anesthesia Transfer of Care Note  Patient: Willie MayhewJohn W Turner  Procedure(s) Performed: Procedure(s): LEFT TOTAL KNEE ARTHROPLASTY (Left)  Patient Location: PACU  Anesthesia Type:MAC and Spinal  Level of Consciousness: awake, alert  and oriented  Airway & Oxygen Therapy: Patient Spontanous Breathing and Patient connected to face mask oxygen  Post-op Assessment: Report given to RN and Post -op Vital signs reviewed and stable  Post vital signs: Reviewed and stable  Last Vitals:  Filed Vitals:   06/27/14 1009  BP: 148/93  Pulse: 81  Temp: 36.8 C  Resp: 18    Complications: No apparent anesthesia complications

## 2014-06-28 ENCOUNTER — Encounter (HOSPITAL_COMMUNITY): Payer: Self-pay | Admitting: Orthopedic Surgery

## 2014-06-28 LAB — BASIC METABOLIC PANEL
ANION GAP: 7 (ref 5–15)
BUN: 15 mg/dL (ref 6–20)
CHLORIDE: 105 mmol/L (ref 101–111)
CO2: 26 mmol/L (ref 22–32)
Calcium: 8.9 mg/dL (ref 8.9–10.3)
Creatinine, Ser: 0.79 mg/dL (ref 0.61–1.24)
GFR calc Af Amer: >60 mL/min (ref >60)
Glucose, Bld: 165 mg/dL — ABNORMAL HIGH (ref 65–99)
POTASSIUM: 3.8 mmol/L (ref 3.5–5.1)
Sodium: 138 mmol/L (ref 135–145)

## 2014-06-28 LAB — CBC
HCT: 36.2 % — ABNORMAL LOW (ref 39.0–52.0)
HEMOGLOBIN: 12.2 g/dL — AB (ref 13.0–17.0)
MCH: 28.6 pg (ref 26.0–34.0)
MCHC: 33.7 g/dL (ref 30.0–36.0)
MCV: 85 fL (ref 78.0–100.0)
PLATELETS: 228 10*3/uL (ref 150–400)
RBC: 4.26 MIL/uL (ref 4.22–5.81)
RDW: 12.5 % (ref 11.5–15.5)
WBC: 10.1 10*3/uL (ref 4.0–10.5)

## 2014-06-28 MED ORDER — NON FORMULARY: 20.0000 mg | Freq: Every day | Status: DC

## 2014-06-28 MED ORDER — OMEPRAZOLE 20 MG PO CPDR
20.0000 mg | DELAYED_RELEASE_CAPSULE | Freq: Every day | ORAL | Status: DC
  Administered 2014-06-28 – 2014-06-30 (×3): 20 mg via ORAL
  Filled 2014-06-28 (×3): qty 1

## 2014-06-28 NOTE — Progress Notes (Signed)
   Subjective: 1 Day Post-Op Procedure(s) (LRB): LEFT TOTAL KNEE ARTHROPLASTY (Left) Patient reports pain as mild.   Patient seen in rounds with Dr. Lequita Halt. Patient is well, but has had some minor complaints of pain in the knee, requiring pain medications We will start therapy today. Did not tolerate the CPM so will discontinue today. Plan is to go Home after hospital stay.  Objective: Vital signs in last 24 hours: Temp:  [97.6 F (36.4 C)-98.2 F (36.8 C)] 97.9 F (36.6 C) (06/09 0530) Pulse Rate:  [54-81] 65 (06/09 0530) Resp:  [15-19] 16 (06/09 0530) BP: (113-154)/(61-93) 121/61 mmHg (06/09 0530) SpO2:  [94 %-100 %] 94 % (06/09 0530) Weight:  [84.369 kg (186 lb)] 84.369 kg (186 lb) (06/08 1038)  Intake/Output from previous day:  Intake/Output Summary (Last 24 hours) at 06/28/14 0734 Last data filed at 06/28/14 0530  Gross per 24 hour  Intake   2390 ml  Output   2490 ml  Net   -100 ml    Intake/Output this shift: UOP 475 since around [place]  Labs:  Recent Labs  06/28/14 0455  HGB 12.2*    Recent Labs  06/28/14 0455  WBC 10.1  RBC 4.26  HCT 36.2*  PLT 228    Recent Labs  06/28/14 0455  NA 138  K 3.8  CL 105  CO2 26  BUN 15  CREATININE 0.79  GLUCOSE 165*  CALCIUM 8.9   No results for input(s): LABPT, INR in the last 72 hours.  EXAM General - Patient is Alert, Appropriate and Oriented Extremity - Neurovascular intact Sensation intact distally Dorsiflexion/Plantar flexion intact Dressing - dressing C/D/I Motor Function - intact, moving foot and toes well on exam.  Hemovac pulled without difficulty.  Past Medical History  Diagnosis Date  . COPD (chronic obstructive pulmonary disease)   . GERD (gastroesophageal reflux disease)   . Arthritis   . Hypertension     not on meds  . Asthma   . Bronchitis   . Right knee DJD   . Memory loss   . Shortness of breath     walking distances or climbing stairs  . Dementia   . Depression   . 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   Weight as of this encounter: 84.369 kg (186 lb). Advance diet Up with therapy Plan for discharge tomorrow Discharge home with home health  DC the CPM  DVT Prophylaxis - Xarelto Weight-Bearing as tolerated to left leg D/C O2 and Pulse OX and try on Room Air  Avel Peace, PA-C Orthopaedic Surgery 06/28/2014, 7:34 AM

## 2014-06-28 NOTE — Anesthesia Postprocedure Evaluation (Signed)
  Anesthesia Post-op Note  Patient: Willie Turner  Procedure(s) Performed: Procedure(s): LEFT TOTAL KNEE ARTHROPLASTY (Left)  Patient Location: PACU  Anesthesia Type:MAC and Spinal  Level of Consciousness: awake  Airway and Oxygen Therapy: Patient Spontanous Breathing  Post-op Pain: mild  Post-op Assessment: Post-op Vital signs reviewed, Patient's Cardiovascular Status Stable, Respiratory Function Stable, Patent Airway, No signs of Nausea or vomiting, Pain level controlled and Spinal receding LLE Motor Response: Purposeful movement   RLE Motor Response: Purposeful movement   L Sensory Level: S1-Sole of foot, small toes R Sensory Level: S1-Sole of foot, small toes  Post-op Vital Signs: Reviewed and stable  Last Vitals:  Filed Vitals:   06/28/14 0954  BP: 132/76  Pulse: 68  Temp: 36.4 C  Resp: 16    Complications: No apparent anesthesia complications

## 2014-06-28 NOTE — Progress Notes (Signed)
Physical Therapy Treatment Patient Details Name: SAVIEN HRDLICKA MRN: [id number] DOB: 1940-05-12 Today's Date: 06/28/2014    History of Present Illness L TKR; dementia with short term memory deficits    PT Comments    Pt progressed to ambulating short distance in hall but continues very unsteady and requiring 2nd person assist for safety.  Pt c/o mild dizziness with mobility (BP 150/91)  Follow Up Recommendations  Home health PT;SNF     Equipment Recommendations  None recommended by PT    Recommendations for Other Services OT consult     Precautions / Restrictions Precautions Precautions: Fall Restrictions Weight Bearing Restrictions: No Other Position/Activity Restrictions: WBAT    Mobility  Bed Mobility Overal bed mobility: Needs Assistance Bed Mobility: Sit to Supine       Sit to supine: Min assist   General bed mobility comments: cues for sequence and use of R LE to self assist  Transfers Overall transfer level: Needs assistance Equipment used: Rolling walker (2 wheeled) Transfers: Sit to/from Stand Sit to Stand: Mod assist         General transfer comment: cues for LE management and use of UEs to self assist; Physical assist to bring weight up and fwd and to correct for posterior drift on initial standing.  Ambulation/Gait Ambulation/Gait assistance: Mod assist;+2 physical assistance;+2 safety/equipment Ambulation Distance (Feet): 38 Feet Assistive device: Rolling walker (2 wheeled) Gait Pattern/deviations: Step-to pattern;Decreased step length - right;Decreased step length - left;Shuffle;Trunk flexed Gait velocity: decr   General Gait Details: cues for posture, sequence, position from RW and increased UE WB.  Physical assist for balance, support and RW management   Stairs            Wheelchair Mobility    Modified Rankin (Stroke Patients Only)       Balance                                    Cognition Arousal/Alertness:  Awake/alert Behavior During Therapy: WFL for tasks assessed/performed Overall Cognitive Status: History of cognitive impairments - at baseline Area of Impairment: Memory     Memory: Decreased short-term memory              Exercises Total Joint Exercises Ankle Circles/Pumps: Both;Supine;15 reps;AAROM Quad Sets: AAROM;Both;10 reps;Supine Heel Slides: AAROM;Left;15 reps;Supine Straight Leg Raises: AAROM;10 reps;Supine;Left    General Comments        Pertinent Vitals/Pain Pain Assessment: 0-10 Pain Score: 5  Pain Location: L knee Pain Descriptors / Indicators: Aching;Sore Pain Intervention(s): Premedicated before session;Monitored during session;Limited activity within patient's tolerance;Ice applied    Home Living                      Prior Function            PT Goals (current goals can now be found in the care plan section) Acute Rehab PT Goals Patient Stated Goal: Walk without pain PT Goal Formulation: With patient Time For Goal Achievement: 07/05/14 Potential to Achieve Goals: Good Progress towards PT goals: Progressing toward goals    Frequency  7X/week    PT Plan Current plan remains appropriate    Co-evaluation             End of Session Equipment Utilized During Treatment: Gait belt;Left knee immobilizer Activity Tolerance: Patient tolerated treatment well Patient left: with call bell/phone within reach;with family/visitor present;in bed  Time: 1610-9604 PT Time Calculation (min) (ACUTE ONLY): 32 min  Charges:  $Gait Training: 8-22 mins $Therapeutic Exercise: 8-22 mins                    G Codes:      Ressie Slevin 07-05-14, 3:22 PM

## 2014-06-28 NOTE — Plan of Care (Signed)
Problem: Phase II Progression Outcomes Goal: Ambulates Outcome: Progressing Slow progression

## 2014-06-28 NOTE — Progress Notes (Signed)
Orthopedic Tech Progress Note Patient Details:  Willie Turner [facility] Nov 23, 1940 [id number]  CPM Left Knee CPM Left Knee: On   Willie Turner 06/28/2014, 3:10 PM

## 2014-06-28 NOTE — Plan of Care (Signed)
Problem: Consults Goal: Diagnosis- Total Joint Replacement Outcome: Completed/Met Date Met:  06/28/14 Primary Total Knee LEFT

## 2014-06-28 NOTE — Progress Notes (Signed)
OT Cancellation Note  Patient Details Name: Willie Turner MRN: [id number] DOB: August 26, 1940   Cancelled Treatment:    Reason Eval/Treat Not Completed: Other (comment) Spoke to PT and pt only able to transfer into chair currently. Will check back on pt next date.  Lennox Laity  [phone] 06/28/2014, 10:54 AM

## 2014-06-28 NOTE — Discharge Instructions (Addendum)
° °  ° °Dr. Frank Aluisio °Total Joint Specialist °[facility] °[address] °[place], [place] [address] °[phone] ° °TOTAL KNEE REPLACEMENT POSTOPERATIVE DIRECTIONS ° °Knee Rehabilitation, Guidelines Following Surgery  °Results after knee surgery are often greatly improved when you follow the exercise, range of motion and muscle strengthening exercises prescribed by your doctor. Safety measures are also important to protect the knee from further injury. Any time any of these exercises cause you to have increased pain or swelling in your knee joint, decrease the amount until you are comfortable again and slowly increase them. If you have problems or questions, call your caregiver or physical therapist for advice.  ° °HOME CARE INSTRUCTIONS  °Remove items at home which could result in a fall. This includes throw rugs or furniture in walking pathways.  °· ICE to the affected knee every three hours for 30 minutes at a time and then as needed for pain and swelling.  Continue to use ice on the knee for pain and swelling from surgery. You may notice swelling that will progress down to the foot and ankle.  This is normal after surgery.  Elevate the leg when you are not up walking on it.   °· Continue to use the breathing machine which will help keep your temperature down.  It is common for your temperature to cycle up and down following surgery, especially at night when you are not up moving around and exerting yourself.  The breathing machine keeps your lungs expanded and your temperature down. °· Do not place pillow under knee, focus on keeping the knee straight while resting ° °DIET °You may resume your previous home diet once your are discharged from the hospital. ° °DRESSING / WOUND CARE / SHOWERING °You may shower 3 days after surgery, but keep the wounds dry during showering.  You may use an occlusive plastic wrap (Press'n Seal for example), NO SOAKING/SUBMERGING IN THE BATHTUB.  If the  bandage gets wet, change with a clean dry gauze.  If the incision gets wet, pat the wound dry with a clean towel. °You may start showering once you are discharged home but do not submerge the incision under water. Just pat the incision dry and apply a dry gauze dressing on daily. °Change the surgical dressing daily and reapply a dry dressing each time. ° °ACTIVITY °Walk with your walker as instructed. °Use walker as long as suggested by your caregivers. °Avoid periods of inactivity such as sitting longer than an hour when not asleep. This helps prevent blood clots.  °You may resume a sexual relationship in one month or when given the OK by your doctor.  °You may return to work once you are cleared by your doctor.  °Do not drive a car for 6 weeks or until released by you surgeon.  °Do not drive while taking narcotics. ° °WEIGHT BEARING °Weight bearing as tolerated with assist device (walker, cane, etc) as directed, use it as long as suggested by your surgeon or therapist, typically at least 4-6 weeks. ° °POSTOPERATIVE CONSTIPATION PROTOCOL °Constipation - defined medically as fewer than three stools per week and severe constipation as less than one stool per week. ° °One of the most common issues patients have following surgery is constipation.  Even if you have a regular bowel pattern at home, your normal regimen is likely to be disrupted due to multiple reasons following surgery.  Combination of anesthesia, postoperative narcotics, change in appetite and fluid intake all can affect   your bowels.  In order to avoid complications following surgery, here are some recommendations in order to help you during your recovery period. ° °Colace (docusate) - Pick up an over-the-counter form of Colace or another stool softener and take twice a day as long as you are requiring postoperative pain medications.  Take with a full glass of water daily.  If you experience loose stools or diarrhea, hold the colace until you stool forms  back up.  If your symptoms do not get better within 1 week or if they get worse, check with your doctor. ° °Dulcolax (bisacodyl) - Pick up over-the-counter and take as directed by the product packaging as needed to assist with the movement of your bowels.  Take with a full glass of water.  Use this product as needed if not relieved by Colace only.  ° °MiraLax (polyethylene glycol) - Pick up over-the-counter to have on hand.  MiraLax is a solution that will increase the amount of water in your bowels to assist with bowel movements.  Take as directed and can mix with a glass of water, juice, soda, coffee, or tea.  Take if you go more than two days without a movement. °Do not use MiraLax more than once per day. Call your doctor if you are still constipated or irregular after using this medication for 7 days in a row. ° °If you continue to have problems with postoperative constipation, please contact the office for further assistance and recommendations.  If you experience "the worst abdominal pain ever" or develop nausea or vomiting, please contact the office immediatly for further recommendations for treatment. ° °ITCHING ° If you experience itching with your medications, try taking only a single pain pill, or even half a pain pill at a time.  You can also use Benadryl over the counter for itching or also to help with sleep.  ° °TED HOSE STOCKINGS °Wear the elastic stockings on both legs for three weeks following surgery during the day but you may remove then at night for sleeping. ° °MEDICATIONS °See your medication summary on the “After Visit Summary” that the nursing staff will review with you prior to discharge.  You may have some home medications which will be placed on hold until you complete the course of blood thinner medication.  It is important for you to complete the blood thinner medication as prescribed by your surgeon.  Continue your approved medications as instructed at time of  discharge. ° °PRECAUTIONS °If you experience chest pain or shortness of breath - call 911 immediately for transfer to the hospital emergency department.  °If you develop a fever greater that 101 F, purulent drainage from wound, increased redness or drainage from wound, foul odor from the wound/dressing, or calf pain - CONTACT YOUR SURGEON.   °                                                °FOLLOW-UP APPOINTMENTS °Make sure you keep all of your appointments after your operation with your surgeon and caregivers. You should call the office at the above phone number and make an appointment for approximately two weeks after the date of your surgery or on the date instructed by your surgeon outlined in the "After Visit Summary". ° ° °RANGE OF MOTION AND STRENGTHENING EXERCISES  °Rehabilitation of the knee is important following a   knee injury or an operation. After just a few days of immobilization, the muscles of the thigh which control the knee become weakened and shrink (atrophy). Knee exercises are designed to build up the tone and strength of the thigh muscles and to improve knee motion. Often times heat used for twenty to thirty minutes before working out will loosen up your tissues and help with improving the range of motion but do not use heat for the first two weeks following surgery. These exercises can be done on a training (exercise) mat, on the floor, on a table or on a bed. Use what ever works the best and is most comfortable for you Knee exercises include:  °Leg Lifts - While your knee is still immobilized in a splint or cast, you can do straight leg raises. Lift the leg to 60 degrees, hold for 3 sec, and slowly lower the leg. Repeat 10-20 times 2-3 times daily. Perform this exercise against resistance later as your knee gets better.  °Quad and Hamstring Sets - Tighten up the muscle on the front of the thigh (Quad) and hold for 5-10 sec. Repeat this 10-20 times hourly. Hamstring sets are done by pushing the  foot backward against an object and holding for 5-10 sec. Repeat as with quad sets.  °· Leg Slides: Lying on your back, slowly slide your foot toward your buttocks, bending your knee up off the floor (only go as far as is comfortable). Then slowly slide your foot back down until your leg is flat on the floor again. °· Angel Wings: Lying on your back spread your legs to the side as far apart as you can without causing discomfort.  °A rehabilitation program following serious knee injuries can speed recovery and prevent re-injury in the future due to weakened muscles. Contact your doctor or a physical therapist for more information on knee rehabilitation.  ° °IF YOU ARE TRANSFERRED TO A SKILLED REHAB FACILITY °If the patient is transferred to a skilled rehab facility following release from the hospital, a list of the current medications will be sent to the facility for the patient to continue.  When discharged from the skilled rehab facility, please have the facility set up the patient's Home Health Physical Therapy prior to being released. Also, the skilled facility will be responsible for providing the patient with their medications at time of release from the facility to include their pain medication, the muscle relaxants, and their blood thinner medication. If the patient is still at the rehab facility at time of the two week follow up appointment, the skilled rehab facility will also need to assist the patient in arranging follow up appointment in our office and any transportation needs. ° °MAKE SURE YOU:  °Understand these instructions.  °Get help right away if you are not doing well or get worse.  ° ° °Pick up stool softner and laxative for home use following surgery while on pain medications. °Do not submerge incision under water. °Please use good hand washing techniques while changing dressing each day. °May shower starting three days after surgery. °Please use a clean towel to pat the incision dry following  showers. °Continue to use ice for pain and swelling after surgery. °Do not use any lotions or creams on the incision until instructed by your surgeon. ° °Take Xarelto for two and a half more weeks, then discontinue Xarelto. °Once the patient has completed the blood thinner regimen, then take a Baby 81 mg Aspirin daily for three more weeks. ° °  Information on my medicine - XARELTO® (Rivaroxaban) ° °This medication education was reviewed with me or my healthcare representative as part of my discharge preparation.  The pharmacist that spoke with me during my hospital stay was:  Williamson, Erin R, RPH ° °Why was Xarelto® prescribed for you? °Xarelto® was prescribed for you to reduce the risk of blood clots forming after orthopedic surgery. The medical term for these abnormal blood clots is venous thromboembolism (VTE). ° °What do you need to know about xarelto® ? °Take your Xarelto® ONCE DAILY at the same time every day. °You may take it either with or without food. ° °If you have difficulty swallowing the tablet whole, you may crush it and mix in applesauce just prior to taking your dose. ° °Take Xarelto® exactly as prescribed by your doctor and DO NOT stop taking Xarelto® without talking to the doctor who prescribed the medication.  Stopping without other VTE prevention medication to take the place of Xarelto® may increase your risk of developing a clot. ° °After discharge, you should have regular check-up appointments with your healthcare provider that is prescribing your Xarelto®.   ° °What do you do if you miss a dose? °If you miss a dose, take it as soon as you remember on the same day then continue your regularly scheduled once daily regimen the next day. Do not take two doses of Xarelto® on the same day.  ° °Important Safety Information °A possible side effect of Xarelto® is bleeding. You should call your healthcare provider right away if you experience any of the following: °? Bleeding from an injury or your  nose that does not stop. °? Unusual colored urine (red or dark brown) or unusual colored stools (red or black). °? Unusual bruising for unknown reasons. °? A serious fall or if you hit your head (even if there is no bleeding). ° °Some medicines may interact with Xarelto® and might increase your risk of bleeding while on Xarelto®. To help avoid this, consult your healthcare provider or pharmacist prior to using any new prescription or non-prescription medications, including herbals, vitamins, non-steroidal anti-inflammatory drugs (NSAIDs) and supplements. ° °This website has more information on Xarelto®: www.xarelto.com. ° ° ° °

## 2014-06-28 NOTE — Evaluation (Signed)
Physical Therapy Evaluation Patient Details Name: Willie Turner MRN: [id number] DOB: 1940/07/23 Today's Date: 06/28/2014   History of Present Illness  L TKR; dementia with short term memory deficits  Clinical Impression  Pt s/p L TKR presents with decreased L LE strength/ROM, post op pain and dementia associated memory deficits limiting functional mobility.  Pt hopes to dc home with family assist and HHPT but may require follow up rehab at SNF level dependent on acute stay progress.    Follow Up Recommendations Home health PT;SNF    Equipment Recommendations  None recommended by PT    Recommendations for Other Services OT consult     Precautions / Restrictions Precautions Precautions: Fall Restrictions Weight Bearing Restrictions: No Other Position/Activity Restrictions: WBAT      Mobility  Bed Mobility Overal bed mobility: Needs Assistance Bed Mobility: Supine to Sit     Supine to sit: Mod assist     General bed mobility comments: cues for sequence and use of R LE to self assist  Transfers Overall transfer level: Needs assistance Equipment used: Rolling walker (2 wheeled) Transfers: Sit to/from Stand Sit to Stand: Mod assist         General transfer comment: cues for LE management and use of UEs to self assist  Ambulation/Gait Ambulation/Gait assistance: Mod assist Ambulation Distance (Feet): 8 Feet Assistive device: Rolling walker (2 wheeled) Gait Pattern/deviations: Step-to pattern;Decreased step length - right;Decreased step length - left;Shuffle;Trunk flexed Gait velocity: decr   General Gait Details: cues for posture, sequence and use of UEs to self assist  Stairs            Wheelchair Mobility    Modified Rankin (Stroke Patients Only)       Balance Overall balance assessment: Needs assistance Sitting-balance support: Bilateral upper extremity supported;Feet supported Sitting balance-Leahy Scale: Fair     Standing balance support:  Bilateral upper extremity supported Standing balance-Leahy Scale: Poor Standing balance comment: posterior balance loss with RW                             Pertinent Vitals/Pain Pain Assessment: 0-10 Pain Score: 5  Pain Location: L knee Pain Descriptors / Indicators: Aching;Sore Pain Intervention(s): Limited activity within patient's tolerance;Monitored during session;Premedicated before session;Ice applied    Home Living Family/patient expects to be discharged to:: Private residence Living Arrangements: Spouse/significant other Available Help at Discharge: Family Type of Home: House Home Access: Stairs to enter Entrance Stairs-Rails: Right;Left;Can reach both Entrance Stairs-Number of Steps: 2 Home Layout: Two level Home Equipment: Environmental consultant - 2 wheels;Bedside commode Additional Comments: Has chair lift to second floor    Prior Function Level of Independence: Independent               Hand Dominance        Extremity/Trunk Assessment   Upper Extremity Assessment: Overall WFL for tasks assessed           Lower Extremity Assessment: LLE deficits/detail   LLE Deficits / Details: 2/5 quads with AAROM at knee -10- 60  Cervical / Trunk Assessment: Normal  Communication   Communication: No difficulties  Cognition Arousal/Alertness: Awake/alert Behavior During Therapy: WFL for tasks assessed/performed Overall Cognitive Status: History of cognitive impairments - at baseline Area of Impairment: Memory     Memory: Decreased short-term memory              General Comments      Exercises Total Joint Exercises Ankle  Circles/Pumps: Both;Supine;15 reps;AAROM Quad Sets: AAROM;Both;10 reps;Supine Heel Slides: AAROM;Left;15 reps;Supine Hip ABduction/ADduction: AAROM;Left;10 reps;Supine      Assessment/Plan    PT Assessment Patient needs continued PT services  PT Diagnosis Difficulty walking   PT Problem List Decreased strength;Decreased range  of motion;Decreased activity tolerance;Decreased mobility;Decreased knowledge of use of DME;Obesity;Pain;Decreased cognition  PT Treatment Interventions DME instruction;Gait training;Stair training;Functional mobility training;Therapeutic activities;Therapeutic exercise;Patient/family education   PT Goals (Current goals can be found in the Care Plan section) Acute Rehab PT Goals Patient Stated Goal: Walk without pain PT Goal Formulation: With patient Time For Goal Achievement: 07/05/14 Potential to Achieve Goals: Good    Frequency 7X/week   Barriers to discharge        Co-evaluation               End of Session Equipment Utilized During Treatment: Gait belt;Left knee immobilizer Activity Tolerance: Patient tolerated treatment well Patient left: in chair;with call bell/phone within reach;with family/visitor present Nurse Communication: Mobility status         Time: 1610-9604 PT Time Calculation (min) (ACUTE ONLY): 42 min   Charges:   PT Evaluation $Initial PT Evaluation Tier I: 1 Procedure PT Treatments $Gait Training: 8-22 mins $Therapeutic Exercise: 8-22 mins   PT G Codes:        Emalynn Clewis 07/07/14, 12:42 PM

## 2014-06-29 LAB — CBC
HCT: 35.4 % — ABNORMAL LOW (ref 39.0–52.0)
HEMOGLOBIN: 11.6 g/dL — AB (ref 13.0–17.0)
MCH: 28.7 pg (ref 26.0–34.0)
MCHC: 32.8 g/dL (ref 30.0–36.0)
MCV: 87.6 fL (ref 78.0–100.0)
Platelets: 244 10*3/uL (ref 150–400)
RBC: 4.04 MIL/uL — ABNORMAL LOW (ref 4.22–5.81)
RDW: 12.9 % (ref 11.5–15.5)
WBC: 9 10*3/uL (ref 4.0–10.5)

## 2014-06-29 LAB — BASIC METABOLIC PANEL
Anion gap: 6 (ref 5–15)
BUN: 20 mg/dL (ref 6–20)
CO2: 29 mmol/L (ref 22–32)
Calcium: 8.6 mg/dL — ABNORMAL LOW (ref 8.9–10.3)
Chloride: 106 mmol/L (ref 101–111)
Creatinine, Ser: 1.03 mg/dL (ref 0.61–1.24)
Glucose, Bld: 143 mg/dL — ABNORMAL HIGH (ref 65–99)
POTASSIUM: 3.4 mmol/L — AB (ref 3.5–5.1)
Sodium: 141 mmol/L (ref 135–145)

## 2014-06-29 MED ORDER — BISACODYL 10 MG RE SUPP: 10.0000 mg | Freq: Every day | RECTAL | Status: DC | PRN

## 2014-06-29 MED ORDER — TRAMADOL HCL 50 MG PO TABS: 50.0000 mg | ORAL_TABLET | Freq: Four times a day (QID) | ORAL | Status: DC | PRN

## 2014-06-29 MED ORDER — POLYETHYLENE GLYCOL 3350 17 G PO PACK: 17.0000 g | PACK | Freq: Every day | ORAL | Status: DC | PRN

## 2014-06-29 MED ORDER — POTASSIUM CHLORIDE CRYS ER 20 MEQ PO TBCR
40.0000 meq | EXTENDED_RELEASE_TABLET | Freq: Once | ORAL | Status: AC
  Administered 2014-06-29: 40 meq via ORAL
  Filled 2014-06-29: qty 2

## 2014-06-29 MED ORDER — ACETAMINOPHEN 325 MG PO TABS: 650.0000 mg | ORAL_TABLET | Freq: Four times a day (QID) | ORAL | Status: DC | PRN

## 2014-06-29 MED ORDER — RIVAROXABAN 10 MG PO TABS: 10.0000 mg | ORAL_TABLET | Freq: Every day | ORAL | Status: DC

## 2014-06-29 MED ORDER — OXYCODONE HCL 5 MG PO TABS: 5.0000 mg | ORAL_TABLET | ORAL | Status: DC | PRN

## 2014-06-29 MED ORDER — DOCUSATE SODIUM 100 MG PO CAPS: 100.0000 mg | ORAL_CAPSULE | Freq: Two times a day (BID) | ORAL | Status: DC

## 2014-06-29 MED ORDER — METHOCARBAMOL 500 MG PO TABS: 500.0000 mg | ORAL_TABLET | Freq: Four times a day (QID) | ORAL | Status: DC | PRN
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
## 2014-06-29 NOTE — Clinical Social Work Placement (Signed)
   CLINICAL SOCIAL WORK PLACEMENT  NOTE  Date:  06/29/2014  Patient Details  Name: Willie Turner MRN: [id number] Date of Birth: 1940/01/25  Clinical Social Work is seeking post-discharge placement for this patient at the Skilled  Nursing Facility level of care (*CSW will initial, date and re-position this form in  chart as items are completed):  Yes   Patient/family provided with [facility] Clinical Social Work Department's list of facilities offering this level of care within the geographic area requested by the patient (or if unable, by the patient's family).  Yes   Patient/family informed of their freedom to choose among providers that offer the needed level of care, that participate in Medicare, Medicaid or managed care program needed by the patient, have an available bed and are willing to accept the patient.  Yes   Patient/family informed of [facility]'s ownership interest in [facility] and [facility], as well as of the fact that they are under no obligation to receive care at these facilities.  PASRR submitted to EDS on 06/29/14     PASRR number received on 06/29/14     Existing PASRR number confirmed on       FL2 transmitted to all facilities in geographic area requested by pt/family on 06/29/14     FL2 transmitted to all facilities within larger geographic area on       Patient informed that his/her managed care company has contracts with or will negotiate with certain facilities, including the following:        Yes   Patient/family informed of bed offers received.  Patient chooses bed at [facility]     Physician recommends and patient chooses bed at      Patient to be transferred to   on  .  Patient to be transferred to facility by       Patient family notified on   of transfer.  Name of family member notified:        PHYSICIAN       Additional Comment:    _______________________________________________ Royetta Asal, LCSW  [phone] 06/29/2014, 10:23 AM

## 2014-06-29 NOTE — Progress Notes (Signed)
Subjective: 2 Days Post-Op Procedure(s) (LRB): LEFT TOTAL KNEE ARTHROPLASTY (Left) Patient reports pain as mild and moderate.   Patient seen in rounds with Dr. Lequita Halt.  Family in room.  He had a difficult day yesterday.  Will see how he does today. IF able to progress and meets all of his goals with therapy, then home later today.  If not progressing well, then may consider rehab. Will notify social worker to start working on a backup plan for the weekend if not doing well today.  All hinges on how he does today. Patient is well, but has had some minor complaints of pain in the knee, requiring pain medications Patient doing okay this morning.  Will see how he does today to determine disposition and when.  Objective: Vital signs in last 24 hours: Temp:  [97.5 F (36.4 C)-99.3 F (37.4 C)] 99.3 F (37.4 C) (06/10 0546) Pulse Rate:  [68-86] 86 (06/10 0546) Resp:  [16] 16 (06/10 0546) BP: (115-132)/(65-76) 115/66 mmHg (06/10 0546) SpO2:  [92 %-94 %] 92 % (06/10 0546)  Intake/Output from previous day:  Intake/Output Summary (Last 24 hours) at 06/29/14 0731 Last data filed at 06/29/14 0607  Gross per 24 hour  Intake    910 ml  Output   1750 ml  Net   -840 ml    Labs:  Recent Labs  06/28/14 0455 06/29/14 0540  HGB 12.2* 11.6*    Recent Labs  06/28/14 0455 06/29/14 0540  WBC 10.1 9.0  RBC 4.26 4.04*  HCT 36.2* 35.4*  PLT 228 244    Recent Labs  06/28/14 0455 06/29/14 0540  NA 138 141  K 3.8 3.4*  CL 105 106  CO2 26 29  BUN 15 20  CREATININE 0.79 1.03  GLUCOSE 165* 143*  CALCIUM 8.9 8.6*   No results for input(s): LABPT, INR in the last 72 hours.  EXAM: General - Patient is Alert and Appropriate Extremity - Neurovascular intact Sensation intact distally Dorsiflexion/Plantar flexion intact Incision - clean, dry, open Motor Function - intact, moving foot and toes well on exam.   Assessment/Plan: 2 Days Post-Op Procedure(s) (LRB): LEFT TOTAL KNEE  ARTHROPLASTY (Left) Procedure(s) (LRB): LEFT TOTAL KNEE ARTHROPLASTY (Left) Past Medical History  Diagnosis Date  . COPD (chronic obstructive pulmonary disease)   . GERD (gastroesophageal reflux disease)   . Arthritis   . Hypertension     not on meds  . Asthma   . Bronchitis   . Right knee DJD   . Memory loss   . Shortness of breath     walking distances or climbing stairs  . Dementia   . Depression   . 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   Weight as of this encounter: 84.369 kg (186 lb). Up with therapy Diet - Cardiac diet Follow up - in 2 weeks Activity - WBAT Disposition - Pending - Home versus Rehab Condition Upon Discharge - Pending at this time. D/C Meds - See DC Summary DVT Prophylaxis - Xarelto   Will see how he does with therapy.  If he needs further assistance and not ready to go home, will have social worker look into a backup plan of skilled rehab, possibly tomorrow. If does well and meets goals, then home later  today after therapy.  All hinges upon his progression with therapy.  Avel Peace, PA-C Orthopaedic Surgery 06/29/2014, 7:31 AM

## 2014-06-29 NOTE — Progress Notes (Signed)
Physical Therapy Treatment Patient Details Name: Willie Turner MRN: [id number] DOB: Apr 21, 1940 Today's Date: 06/29/2014    History of Present Illness L TKR; dementia with short term memory deficits    PT Comments    Pt continues very unstable with ambulation and requiring constant cueing for safety, posture, position from RW, stride length and sequence.  Follow Up Recommendations  Home health PT;SNF     Equipment Recommendations  None recommended by PT    Recommendations for Other Services OT consult     Precautions / Restrictions Precautions Precautions: Fall Required Braces or Orthoses: Knee Immobilizer - Left Knee Immobilizer - Left: Discontinue once straight leg raise with < 10 degree lag Restrictions Weight Bearing Restrictions: No Other Position/Activity Restrictions: WBAT    Mobility  Bed Mobility Overal bed mobility: Needs Assistance Bed Mobility: Sit to Supine       Sit to supine: Mod assist   General bed mobility comments: cues for sequence and use of R LE to self assist  Transfers Overall transfer level: Needs assistance Equipment used: Rolling walker (2 wheeled) Transfers: Sit to/from Stand Sit to Stand: Mod assist;+2 physical assistance;+2 safety/equipment         General transfer comment: cues for LE management and use of UEs to self assist; Physical assist to bring weight up and fwd and to correct for posterior drift on initial standing.  Ambulation/Gait Ambulation/Gait assistance: Mod assist;+2 physical assistance Ambulation Distance (Feet): 26 Feet (and 5' from comode to bed) Assistive device: Rolling walker (2 wheeled) Gait Pattern/deviations: Step-to pattern;Decreased step length - right;Decreased step length - left;Shuffle;Trunk flexed Gait velocity: decr   General Gait Details: cues for posture, sequence, position from RW and increased UE WB.  Physical assist for balance, support and RW management   Stairs             Wheelchair Mobility    Modified Rankin (Stroke Patients Only)       Balance                                    Cognition Arousal/Alertness: Awake/alert Behavior During Therapy: WFL for tasks assessed/performed Overall Cognitive Status: History of cognitive impairments - at baseline Area of Impairment: Memory     Memory: Decreased short-term memory              Exercises      General Comments        Pertinent Vitals/Pain Pain Assessment: Faces Faces Pain Scale: Hurts even more Pain Location: L knee Pain Descriptors / Indicators: Aching;Sore Pain Intervention(s): Limited activity within patient's tolerance;Monitored during session;Premedicated before session;Ice applied    Home Living                      Prior Function            PT Goals (current goals can now be found in the care plan section) Acute Rehab PT Goals Patient Stated Goal: Walk without pain PT Goal Formulation: With patient Time For Goal Achievement: 07/05/14 Potential to Achieve Goals: Good Progress towards PT goals: Progressing toward goals    Frequency  7X/week    PT Plan Current plan remains appropriate    Co-evaluation             End of Session Equipment Utilized During Treatment: Gait belt;Left knee immobilizer Activity Tolerance: Patient tolerated treatment well Patient left: with call bell/phone within  reach;with family/visitor present;in bed     Time: 1425-1455 PT Time Calculation (min) (ACUTE ONLY): 30 min  Charges:  $Gait Training: 23-37 mins                    G Codes:      Willie Turner 07-07-2014, 3:24 PM

## 2014-06-29 NOTE — Progress Notes (Signed)
Physical Therapy Treatment Patient Details Name: Willie Turner MRN: [id number] DOB: [birth year] Today's Date: 06/29/2014    History of Present Illness L TKR; dementia with short term memory deficits    PT Comments    Pt continues very cooperative but very unstable with attempts to ambulate and requiring increased time with all tasks 2* dementia.  Follow Up Recommendations  Home health PT;SNF     Equipment Recommendations  None recommended by PT    Recommendations for Other Services OT consult     Precautions / Restrictions Precautions Precautions: Fall Restrictions Weight Bearing Restrictions: No Other Position/Activity Restrictions: WBAT    Mobility  Bed Mobility Overal bed mobility: Needs Assistance Bed Mobility: Supine to Sit     Supine to sit: Mod assist     General bed mobility comments: cues for sequence and use of R LE to self assist  Transfers Overall transfer level: Needs assistance Equipment used: Rolling walker (2 wheeled) Transfers: Sit to/from Stand Sit to Stand: Mod assist;+2 physical assistance;+2 safety/equipment         General transfer comment: cues for LE management and use of UEs to self assist; Physical assist to bring weight up and fwd and to correct for posterior drift on initial standing.  Ambulation/Gait Ambulation/Gait assistance: Mod assist;+2 physical assistance;+2 safety/equipment Ambulation Distance (Feet): 34 Feet Assistive device: Rolling walker (2 wheeled) Gait Pattern/deviations: Step-to pattern;Decreased step length - right;Decreased step length - left;Shuffle;Trunk flexed Gait velocity: decr   General Gait Details: cues for posture, sequence, position from RW and increased UE WB.  Physical assist for balance, support and RW management   Stairs            Wheelchair Mobility    Modified Rankin (Stroke Patients Only)       Balance Overall balance assessment: Needs assistance Sitting-balance support:  Bilateral upper extremity supported Sitting balance-Leahy Scale: Fair     Standing balance support: Bilateral upper extremity supported Standing balance-Leahy Scale: Poor                      Cognition Arousal/Alertness: Awake/alert Behavior During Therapy: WFL for tasks assessed/performed Overall Cognitive Status: History of cognitive impairments - at baseline Area of Impairment: Memory     Memory: Decreased short-term memory              Exercises Total Joint Exercises Ankle Circles/Pumps: Both;Supine;15 reps;AAROM Quad Sets: AAROM;Both;Supine;20 reps Heel Slides: AAROM;Left;15 reps;Supine Straight Leg Raises: AAROM;Supine;Left;20 reps Goniometric ROM: AAROM at L knee -10 - 60    General Comments        Pertinent Vitals/Pain Pain Assessment: Faces Faces Pain Scale: Hurts even more Pain Location: L knee Pain Descriptors / Indicators: Aching;Sore Pain Intervention(s): Limited activity within patient's tolerance;Monitored during session;Premedicated before session;Ice applied    Home Living                      Prior Function            PT Goals (current goals can now be found in the care plan section) Acute Rehab PT Goals Patient Stated Goal: Walk without pain PT Goal Formulation: With patient Time For Goal Achievement: 07/05/14 Potential to Achieve Goals: Good Progress towards PT goals: Progressing toward goals    Frequency  7X/week    PT Plan Current plan remains appropriate    Co-evaluation             End of Session Equipment Utilized During Treatment:  Gait belt;Left knee immobilizer Activity Tolerance: Patient tolerated treatment well Patient left: with call bell/phone within reach;with family/visitor present;in chair     Time: 1856-3149 PT Time Calculation (min) (ACUTE ONLY): 39 min  Charges:  $Gait Training: 8-22 mins $Therapeutic Exercise: 23-37 mins                    G Codes:      Ramsha Lonigro 07-15-14,  11:50 AM

## 2014-06-29 NOTE — Progress Notes (Signed)
OT Cancellation Note  Patient Details Name: MINARD BORJAS MRN: [id number] DOB: 02/19/1940   Cancelled Treatment:    Reason Eval/Treat Not Completed: Other (comment)  Spoke to SW and plan is SNF.  Will defer OT to that venue.  If d/c plan changes, we will see him in acute.    Takoya Jonas 06/29/2014, 1:28 PM  Marica Otter, OTR/L [phone] 06/29/2014

## 2014-06-29 NOTE — Progress Notes (Signed)
ST Rehab bed available at [facility] for pt if stable for d/c.  Cori Razor LCSW [phone]

## 2014-06-29 NOTE — Clinical Social Work Note (Signed)
Clinical Social Work Assessment  Patient Details  Name: Willie Turner MRN: [id number] Date of Birth: May 04, 1940  Date of referral:  06/29/14               Reason for consult:  Discharge Planning, Facility Placement                Permission sought to share information with:    Permission granted to share information::     Name::        Agency::     Relationship::     Contact Information:     Housing/Transportation Living arrangements for the past 2 months:  Single Family Home Source of Information:  Patient, Spouse Patient Interpreter Needed:  None Criminal Activity/Legal Involvement Pertinent to Current Situation/Hospitalization:  No - Comment as needed Significant Relationships:  Siblings, Spouse Lives with:  Spouse Do you feel safe going back to the place where you live?   (SNF placement needed.) Need for family participation in patient care:  Yes (Comment)  Care giving concerns:  Pt 's care cannot be managed at home following hospital d/c.   Social Worker assessment / plan:  Pt hospitalized on 06/27/14 for pre planned knee surgery. He is part of the medicare bundle program. Pt was hoping to d/c home with [facility] services following hospital d/c. CSW spoke with PT, PA, pt,family and PN reviewed. Pt is progressing slowly with therapy. MD / PT are recommending ST Rehab at d/c. Pt / family are requesting [facility] for rehab. SNF contacted and is able to offer placement. Expecting d/c SAT.  Employment status:  Retired Health and safety inspector:  Medicare PT Recommendations:  Skilled Nursing Facility Information / Referral to community resources:  Skilled Nursing Facility  Patient/Family's Response to care:  Pt prefers home, family feels ST Rehab is needed.  Patient/Family's Understanding of and Emotional Response to Diagnosis, Current Treatment, and Prognosis:  Pt is disappointed that he is not progressing as quickly as he had hoped. Pt is willing to accept rehab if MD recommends this. Family  worries about pt's safety at home and feels rehab is needed. Emotional Assessment Appearance:  Appears stated age Attitude/Demeanor/Rapport:  Other (cooperative) Affect (typically observed):  Appropriate, Pleasant Orientation:  Oriented to Self, Oriented to Place, Oriented to  Time, Oriented to Situation Alcohol / Substance use:  Not Applicable Psych involvement (Current and /or in the community):  No (Comment)  Discharge Needs  Concerns to be addressed:  Discharge Planning Concerns Readmission within the last 30 days:  No Current discharge risk:  None Barriers to Discharge:  No Barriers Identified   Max Nuno, Dickey Gave, LCSW 06/29/2014, 10:13 AM

## 2014-06-30 ENCOUNTER — Inpatient Hospital Stay
Admission: RE | Admit: 2014-06-30 | Discharge: 2014-07-09 | Disposition: A | Discharge status: Home / Self Care | Payer: 59 | Source: Ambulatory Visit | Attending: Internal Medicine | Admitting: Internal Medicine

## 2014-06-30 LAB — CBC
HCT: 33 % — ABNORMAL LOW (ref 39.0–52.0)
Hemoglobin: 10.8 g/dL — ABNORMAL LOW (ref 13.0–17.0)
MCH: 29 pg (ref 26.0–34.0)
MCHC: 32.7 g/dL (ref 30.0–36.0)
MCV: 88.5 fL (ref 78.0–100.0)
PLATELETS: 240 10*3/uL (ref 150–400)
RBC: 3.73 MIL/uL — AB (ref 4.22–5.81)
RDW: 13.1 % (ref 11.5–15.5)
WBC: 7.3 10*3/uL (ref 4.0–10.5)

## 2014-06-30 MED ORDER — DIAZEPAM 2 MG PO TABS
2.0000 mg | ORAL_TABLET | Freq: Once | ORAL | Status: AC | PRN
  Administered 2014-06-30: 2 mg via ORAL
  Filled 2014-06-30: qty 1

## 2014-06-30 NOTE — Clinical Social Work Placement (Signed)
   CLINICAL SOCIAL WORK PLACEMENT  NOTE  Date:  06/30/2014  Patient Details  Name: Willie Turner MRN: [id number] Date of Birth: [birth year]  Clinical Social Work is seeking post-discharge placement for this patient at the Skilled  Nursing Facility level of care (*CSW will initial, date and re-position this form in  chart as items are completed):  Yes   Patient/family provided with [place] Clinical Social Work Department's list of facilities offering this level of care within the geographic area requested by the patient (or if unable, by the patient's family).  Yes   Patient/family informed of their freedom to choose among providers that offer the needed level of care, that participate in Medicare, Medicaid or managed care program needed by the patient, have an available bed and are willing to accept the patient.  Yes   Patient/family informed of [facility]'s ownership interest in [facility] and [facility], as well as of the fact that they are under no obligation to receive care at these facilities.  PASRR submitted to EDS on 06/29/14     PASRR number received on 06/29/14     Existing PASRR number confirmed on       FL2 transmitted to all facilities in geographic area requested by pt/family on 06/29/14     FL2 transmitted to all facilities within larger geographic area on       Patient informed that his/her managed care company has contracts with or will negotiate with certain facilities, including the following:        Yes   Patient/family informed of bed offers received.  Patient chooses bed at [facility]     Physician recommends and patient chooses bed at      Patient to be transferred to [facility]  on June 30, 2014  .  Patient to be transferred to facility by  ambulance     Patient family notified  on  June 30, 2014 of transfer.  Name of family member notified:    Delorise Shiner Yahnke wife    PHYSICIAN       Additional Comment:     _______________________________________________ Annetta Maw, LCSW 06/30/2014, 10:22 AM

## 2014-06-30 NOTE — Plan of Care (Signed)
Problem: Discharge Progression Outcomes Goal: Anticoagulant follow-up in place Outcome: Not Applicable Date Met:  62/37/62 xarelto Goal: Discharge plan in place and appropriate Outcome: Completed/Met Date Met:  06/30/14 To [facility]

## 2014-06-30 NOTE — Progress Notes (Signed)
     Subjective: 3 Days Post-Op Procedure(s) (LRB): LEFT TOTAL KNEE ARTHROPLASTY (Left)   Patient reports pain as mild, pain controlled. Resting comfortably in bed. No events throughout the night.  He mentioned his brother pushing him to do the PT.  We had a good discussion on PT and it's importance. Ready to be discharged to skilled nursing facility.  Objective:   VITALS:   Filed Vitals:   06/30/14 0509  BP: 123/63  Pulse: 79  Temp: 100.9 F (38.3 C)  Resp: 16    Dorsiflexion/Plantar flexion intact Incision: dressing C/D/I No cellulitis present Compartment soft  LABS  Recent Labs  06/28/14 0455 06/29/14 0540 06/30/14 0514  HGB 12.2* 11.6* 10.8*  HCT 36.2* 35.4* 33.0*  WBC 10.1 9.0 7.3  PLT 228 244 240     Recent Labs  06/28/14 0455 06/29/14 0540  NA 138 141  K 3.8 3.4*  BUN 15 20  CREATININE 0.79 1.03  GLUCOSE 165* 143*     Assessment/Plan: 3 Days Post-Op Procedure(s) (LRB): LEFT TOTAL KNEE ARTHROPLASTY (Left) Up with therapy Discharge to SNF  Follow up in 2 weeks at [facility]. Follow up with Dr. Lequita Halt in 2 weeks.  Contact information:  [facility] [address] [place] [address] [phone]        Willie Turner. Willie Turner   PAC  06/30/2014, 7:38 AM

## 2014-06-30 NOTE — Progress Notes (Addendum)
Discharged from floor via stretcher, EMT with pt, belongings with family. No change in assessment. Maudie Shingledecker, [employer]

## 2014-06-30 NOTE — Care Management Note (Addendum)
Case Management Note  Patient Details  Name: Willie Turner MRN: [id number] Date of Birth: Mar 02, 1940  Subjective/Objective:        LEFT TOTAL KNEE ARTHROPLASTY             Action/Plan:  Discharge to SNF-rehab Expected Discharge Date:   06/30/2014               Expected Discharge Plan:  Skilled Nursing Facility  In-House Referral:  Clinical Social Work  Discharge planning Services  CM Consult   Status of Service:  Completed, signed off  Medicare Important Message Given:  Yes Date Medicare IM Given:  06/30/14 Medicare IM give by:  Isidoro Donning RN CCM  Date Additional Medicare IM Given:    Additional Medicare Important Message give by:     If discussed at Long Length of Stay Meetings, dates discussed:    Additional Comments: Planned dc to SNF. No NCM needs identified.   Elliot Cousin, RN 06/30/2014, 10:09 AM

## 2014-06-30 NOTE — Progress Notes (Signed)
Physical Therapy Treatment Patient Details Name: Willie Turner MRN: [id number] DOB: Jun 03, 1940 Today's Date: 06/30/2014    History of Present Illness L TKR; dementia with short term memory deficits    PT Comments    Pt continues very cooperative but with progress ltd by lack of carryover 2* dementia  Follow Up Recommendations        Equipment Recommendations       Recommendations for Other Services       Precautions / Restrictions Precautions Precautions: Fall Required Braces or Orthoses: Knee Immobilizer - Left Knee Immobilizer - Left: Discontinue once straight leg raise with < 10 degree lag Restrictions Weight Bearing Restrictions: No Other Position/Activity Restrictions: WBAT    Mobility  Bed Mobility Overal bed mobility: Needs Assistance Bed Mobility: Supine to Sit     Supine to sit: Mod assist     General bed mobility comments: cues for sequence and use of R LE to self assist  Transfers Overall transfer level: Needs assistance Equipment used: Rolling walker (2 wheeled) Transfers: Sit to/from Stand Sit to Stand: Mod assist;+2 physical assistance;+2 safety/equipment         General transfer comment: cues for LE management and use of UEs to self assist; Physical assist to bring weight up and fwd and to correct for posterior drift on initial standing.  Ambulation/Gait Ambulation/Gait assistance: Mod assist;+2 physical assistance;+2 safety/equipment Ambulation Distance (Feet): 26 Feet Assistive device: Rolling walker (2 wheeled) Gait Pattern/deviations: Step-to pattern;Step-through pattern;Decreased step length - left;Decreased step length - right;Shuffle;Trunk flexed Gait velocity: decr   General Gait Details: cues for posture, sequence, position from RW and increased UE WB.  Physical assist for balance, support and RW management   Stairs            Wheelchair Mobility    Modified Rankin (Stroke Patients Only)       Balance                                     Cognition Arousal/Alertness: Awake/alert Behavior During Therapy: WFL for tasks assessed/performed Overall Cognitive Status: History of cognitive impairments - at baseline Area of Impairment: Memory     Memory: Decreased short-term memory              Exercises Total Joint Exercises Ankle Circles/Pumps: Both;Supine;15 reps;AAROM Quad Sets: AAROM;Both;Supine;20 reps Heel Slides: AAROM;Left;15 reps;Supine Hip ABduction/ADduction: AAROM;Left;10 reps;Supine Straight Leg Raises: AAROM;Supine;Left;20 reps    General Comments        Pertinent Vitals/Pain Pain Assessment: Faces Faces Pain Scale: Hurts even more Pain Location: L knee Pain Descriptors / Indicators: Sore Pain Intervention(s): Limited activity within patient's tolerance;Monitored during session;Premedicated before session;Ice applied    Home Living                      Prior Function            PT Goals (current goals can now be found in the care plan section) Acute Rehab PT Goals Patient Stated Goal: Walk without pain    Frequency       PT Plan      Co-evaluation             End of Session           Time: 7829-5621 PT Time Calculation (min) (ACUTE ONLY): 31 min  Charges:  G Codes:      Willie Turner Jul 29, 2014, 12:35 PM

## 2014-06-30 NOTE — Progress Notes (Signed)
Physical Therapy Treatment Patient Details Name: Willie Turner MRN: [id number] DOB: 09/27/1940 Today's Date: 06/30/2014    History of Present Illness L TKR; dementia with short term memory deficits    PT Comments    Pt continues very cooperative but with progress limited by lack of carryover 2* dementia  Follow Up Recommendations  Home health PT;SNF     Equipment Recommendations  None recommended by PT    Recommendations for Other Services OT consult     Precautions / Restrictions Precautions Precautions: Fall Required Braces or Orthoses: Knee Immobilizer - Left Knee Immobilizer - Left: Discontinue once straight leg raise with < 10 degree lag Restrictions Weight Bearing Restrictions: No Other Position/Activity Restrictions: WBAT    Mobility  Bed Mobility Overal bed mobility: Needs Assistance Bed Mobility: Supine to Sit     Supine to sit: Mod assist     General bed mobility comments: cues for sequence and use of R LE to self assist  Transfers Overall transfer level: Needs assistance Equipment used: Rolling walker (2 wheeled) Transfers: Sit to/from Stand Sit to Stand: Mod assist;+2 physical assistance;+2 safety/equipment         General transfer comment: cues for LE management and use of UEs to self assist; Physical assist to bring weight up and fwd and to correct for posterior drift on initial standing.  Ambulation/Gait Ambulation/Gait assistance: Mod assist;+2 physical assistance;+2 safety/equipment Ambulation Distance (Feet): 26 Feet Assistive device: Rolling walker (2 wheeled) Gait Pattern/deviations: Step-to pattern;Step-through pattern;Decreased step length - left;Decreased step length - right;Shuffle;Trunk flexed Gait velocity: decr   General Gait Details: cues for posture, sequence, position from RW and increased UE WB.  Physical assist for balance, support and RW management   Stairs            Wheelchair Mobility    Modified Rankin  (Stroke Patients Only)       Balance                                    Cognition Arousal/Alertness: Awake/alert Behavior During Therapy: WFL for tasks assessed/performed Overall Cognitive Status: History of cognitive impairments - at baseline Area of Impairment: Memory     Memory: Decreased short-term memory              Exercises Total Joint Exercises Ankle Circles/Pumps: Both;Supine;15 reps;AAROM Quad Sets: AAROM;Both;Supine;20 reps Heel Slides: AAROM;Left;15 reps;Supine Hip ABduction/ADduction: AAROM;Left;10 reps;Supine Straight Leg Raises: AAROM;Supine;Left;20 reps    General Comments        Pertinent Vitals/Pain Pain Assessment: Faces Faces Pain Scale: Hurts even more Pain Location: L knee Pain Descriptors / Indicators: Sore Pain Intervention(s): Limited activity within patient's tolerance;Monitored during session;Premedicated before session;Ice applied    Home Living                      Prior Function            PT Goals (current goals can now be found in the care plan section) Acute Rehab PT Goals Patient Stated Goal: Walk without pain PT Goal Formulation: With patient Time For Goal Achievement: 07/05/14 Potential to Achieve Goals: Good Progress towards PT goals: Progressing toward goals    Frequency  7X/week    PT Plan Current plan remains appropriate    Co-evaluation             End of Session Equipment Utilized During Treatment: Gait belt;Left knee immobilizer  Activity Tolerance: Patient tolerated treatment well Patient left: with call bell/phone within reach;with family/visitor present;in bed     Time: 1610-9604 PT Time Calculation (min) (ACUTE ONLY): 31 min  Charges:  $Gait Training: 8-22 mins $Therapeutic Exercise: 8-22 mins                    G Codes:      Graysin Luczynski 07-27-2014, 12:38 PM

## 2014-06-30 NOTE — Progress Notes (Signed)
Report phoned to [facility]. Willie Turner, [employer]

## 2014-07-01 LAB — TYPE AND SCREEN
ABO/RH(D): A POS
Antibody Screen: POSITIVE
DAT, IgG: NEGATIVE
DONOR AG TYPE: NEGATIVE
Donor AG Type: NEGATIVE
UNIT DIVISION: 0
Unit division: 0

## 2014-07-02 ENCOUNTER — Encounter: Payer: Self-pay | Admitting: Internal Medicine

## 2014-07-02 ENCOUNTER — Other Ambulatory Visit: Payer: Self-pay | Admitting: *Deleted

## 2014-07-02 ENCOUNTER — Encounter (HOSPITAL_COMMUNITY)
Admission: RE | Admit: 2014-07-02 | Discharge: 2014-07-02 | Disposition: A | Discharge status: Home / Self Care | Payer: Medicare Other | Source: Skilled Nursing Facility | Attending: Internal Medicine | Admitting: Internal Medicine

## 2014-07-02 ENCOUNTER — Non-Acute Institutional Stay (SKILLED_NURSING_FACILITY): Payer: Medicare Other | Admitting: Internal Medicine

## 2014-07-02 DIAGNOSIS — J449 Chronic obstructive pulmonary disease, unspecified: Secondary | ICD-10-CM | POA: Diagnosis not present

## 2014-07-02 DIAGNOSIS — Z96652 Presence of left artificial knee joint: Secondary | ICD-10-CM

## 2014-07-02 DIAGNOSIS — F0150 Vascular dementia without behavioral disturbance: Secondary | ICD-10-CM | POA: Diagnosis not present

## 2014-07-02 LAB — CBC WITH DIFFERENTIAL/PLATELET
Basophils Absolute: 0 10*3/uL (ref 0.0–0.1)
Basophils Relative: 0 % (ref 0–1)
Eosinophils Absolute: 0.4 10*3/uL (ref 0.0–0.7)
Eosinophils Relative: 5 % (ref 0–5)
HCT: 35.5 % — ABNORMAL LOW (ref 39.0–52.0)
HEMOGLOBIN: 11.8 g/dL — AB (ref 13.0–17.0)
LYMPHS ABS: 1.5 10*3/uL (ref 0.7–4.0)
Lymphocytes Relative: 21 % (ref 12–46)
MCH: 28.9 pg (ref 26.0–34.0)
MCHC: 33.2 g/dL (ref 30.0–36.0)
MCV: 87 fL (ref 78.0–100.0)
MONO ABS: 0.6 10*3/uL (ref 0.1–1.0)
MONOS PCT: 8 % (ref 3–12)
NEUTROS PCT: 66 % (ref 43–77)
Neutro Abs: 4.7 10*3/uL (ref 1.7–7.7)
Platelets: 317 10*3/uL (ref 150–400)
RBC: 4.08 MIL/uL — AB (ref 4.22–5.81)
RDW: 12.7 % (ref 11.5–15.5)
WBC: 7.2 10*3/uL (ref 4.0–10.5)

## 2014-07-02 MED ORDER — TRAMADOL HCL 50 MG PO TABS: 50.0000 mg | ORAL_TABLET | Freq: Four times a day (QID) | ORAL | Status: DC | PRN

## 2014-07-02 NOTE — Telephone Encounter (Signed)
[facility]  

## 2014-07-03 NOTE — Progress Notes (Addendum)
Patient ID: Willie Turner, male   DOB: 05-02-1940, 74 y.o.   MRN: [id number]                 HISTORY & PHYSICAL  DATE:  07/02/2014            FACILITY: [facility]                    LEVEL OF CARE:   SNF   CHIEF COMPLAINT:  Admission to SNF, post stay at [facility], 06/27/2014 through 06/30/2014.   The patient was admitted for elective left total knee replacement.    HISTORY OF PRESENT ILLNESS:  This is a 74 year-old man who was admitted for elective left total knee replacement.  He has a history of a right total knee replacement previously.  He had gotten to the point where he walked with a walker at home due to pain.      He also has a history of dementia, apparently diagnosed by Dr. Vickey Huger as Lewy body dementia.    He appears to have tolerated the procedure well.    I do not see any major postoperative issues.    Discharge hemoglobin was 10.  It has already been repeated at 11.8.  White count is 7.2.    He is still having some postoperative pain, although he is managing well.    His only major complaint is constipation.    PAST MEDICAL HISTORY/PROBLEM LIST:            COPD.    Gastroesophageal reflux.    Osteoarthritis.    Hypertension.  Not currently on medication.    Asthmatic bronchitis.    History of right total knee replacement.    Dementia.  ?Lewy body dementia, per his wife.    Depression.    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    CURRENT MEDICATIONS:  Medication list is reviewed.                     Albuterol q.4 p.r.n.       Dulcolax 10 daily p.r.n.        Colace 100 b.i.d.       Allegra 180 q.d. for allergy symptoms.     Advair 250/50, 1 puff b.i.d.       Namenda XR 28 daily.     Robaxin 500 q.6 p.r.n.       Prilosec 20 q.d.       OxyIR 1-2/5-10 q.3 p.r.n.       MiraLAX 17 g as needed.     Xarelto 10 mg daily for 2-1/2 weeks, then switch to baby aspirin 81 mg for three more weeks.    Tramadol 50 mg q.6 hours  p.r.n. mild pain.     SOCIAL HISTORY:                   HOUSING:  Lives in [place] with his wife.   FUNCTIONAL STATUS:  As mentioned, used a walker.        REVIEW OF SYSTEMS:            CHEST/RESPIRATORY:  No shortness of breath.     CARDIAC:  No chest pain.    GI:  No nausea, vomiting, or abdominal pain.  He is complaining of constipation.   MUSCULOSKELETAL:  Extremities:  Pain in his left knee, but no other major complaints.    Physical exam Gen. the patient is pleasant  and cooperative. His wife is present. Respiratory clear entry bilaterally Cardiac heart sounds are normal no murmurs Abdomen no liver no spleen no tenderness Musculoskeletal; left knee-there is some erythema here and some mild warmth. Possibly a mild effusion. There is some swelling in the left leg but no clear evidence of a DVT  ASSESSMENT/PLAN:                Status post left total knee replacement.  He appears to have tolerated this well.  There is some erythema here.  I will recheck this on Wednesday for stability.  There may be a small effusion, as well.    COPD.  This is stable.    Discharge potassium of 3.4 on 06/29/2014.  I cannot see that this has been repeated.  I will recheck this.        History of dementia, apparently Lewy body.  This is probably mild to moderate, in discussion with his wife.   He is on Namenda for this.     CPT CODE: 15830

## 2014-07-04 ENCOUNTER — Non-Acute Institutional Stay (SKILLED_NURSING_FACILITY): Payer: Medicare Other | Admitting: Internal Medicine

## 2014-07-04 ENCOUNTER — Encounter (HOSPITAL_COMMUNITY)
Admission: RE | Admit: 2014-07-04 | Discharge: 2014-07-04 | Disposition: A | Discharge status: Home / Self Care | Payer: Medicare Other | Source: Skilled Nursing Facility | Attending: Internal Medicine | Admitting: Internal Medicine

## 2014-07-04 DIAGNOSIS — Z96652 Presence of left artificial knee joint: Secondary | ICD-10-CM

## 2014-07-04 LAB — BASIC METABOLIC PANEL
Anion gap: 8 (ref 5–15)
BUN: 17 mg/dL (ref 6–20)
CALCIUM: 8.9 mg/dL (ref 8.9–10.3)
CO2: 29 mmol/L (ref 22–32)
Chloride: 100 mmol/L — ABNORMAL LOW (ref 101–111)
Creatinine, Ser: 0.84 mg/dL (ref 0.61–1.24)
GFR calc non Af Amer: >60 mL/min (ref >60)
Glucose, Bld: 113 mg/dL — ABNORMAL HIGH (ref 65–99)
Potassium: 3.8 mmol/L (ref 3.5–5.1)
Sodium: 137 mmol/L (ref 135–145)

## 2014-07-07 NOTE — Progress Notes (Addendum)
Patient ID: Willie Turner, male   DOB: 05/25/1940, 74 y.o.   MRN: [id number]                PROGRESS NOTE  DATE:  07/04/2014              FACILITY: [facility]                    LEVEL OF CARE:   SNF   Acute Visit                    CHIEF COMPLAINT:  Follow up swollen left knee.      HISTORY OF PRESENT ILLNESS:  The patient is here following an elective left total knee arthroplasty.    When I admitted him to the building two days ago, I noted some erythema and warmth.  I wanted to follow  up on this.    PHYSICAL EXAMINATION:   MUSCULOSKELETAL:   EXTREMITIES:   LEFT LOWER EXTREMITY:  Left knee:  I think the erythema and swelling are actually better and this suggests that this is not any form of infection, simply postoperative inflammation.  There is no evidence of a DVT in the left leg.  The patient still complains of soreness.   However, I think this is all benign.    ASSESSMENT/PLAN:                     Status post left total knee replacement.  Any concerns I had about cellulitis are better today.  I think everything is stable.

## 2014-07-09 ENCOUNTER — Non-Acute Institutional Stay (SKILLED_NURSING_FACILITY): Payer: Medicare Other | Admitting: Internal Medicine

## 2014-07-09 DIAGNOSIS — J449 Chronic obstructive pulmonary disease, unspecified: Secondary | ICD-10-CM | POA: Diagnosis not present

## 2014-07-09 DIAGNOSIS — Z96652 Presence of left artificial knee joint: Secondary | ICD-10-CM | POA: Diagnosis not present

## 2014-07-10 DIAGNOSIS — Z96652 Presence of left artificial knee joint: Secondary | ICD-10-CM | POA: Diagnosis not present

## 2014-07-10 DIAGNOSIS — R2689 Other abnormalities of gait and mobility: Secondary | ICD-10-CM | POA: Diagnosis not present

## 2014-07-10 DIAGNOSIS — Z471 Aftercare following joint replacement surgery: Secondary | ICD-10-CM | POA: Diagnosis not present

## 2014-07-11 DIAGNOSIS — Z471 Aftercare following joint replacement surgery: Secondary | ICD-10-CM | POA: Diagnosis not present

## 2014-07-11 DIAGNOSIS — Z96652 Presence of left artificial knee joint: Secondary | ICD-10-CM | POA: Diagnosis not present

## 2014-07-11 DIAGNOSIS — R2689 Other abnormalities of gait and mobility: Secondary | ICD-10-CM | POA: Diagnosis not present

## 2014-07-12 DIAGNOSIS — Z471 Aftercare following joint replacement surgery: Secondary | ICD-10-CM | POA: Diagnosis not present

## 2014-07-12 DIAGNOSIS — Z96652 Presence of left artificial knee joint: Secondary | ICD-10-CM | POA: Diagnosis not present

## 2014-07-13 DIAGNOSIS — R2689 Other abnormalities of gait and mobility: Secondary | ICD-10-CM | POA: Diagnosis not present

## 2014-07-13 DIAGNOSIS — Z471 Aftercare following joint replacement surgery: Secondary | ICD-10-CM | POA: Diagnosis not present

## 2014-07-13 DIAGNOSIS — Z96652 Presence of left artificial knee joint: Secondary | ICD-10-CM | POA: Diagnosis not present

## 2014-07-14 NOTE — Progress Notes (Signed)
Patient ID: Willie Turner, male   DOB: [birth year], 74 y.o.   MRN: [id number]                PROGRESS NOTE  DATE:  07/09/2014             FACILITY: [facility]                       LEVEL OF CARE:   SNF   Acute Visit/Discharge Visit                  CHIEF COMPLAINT:  Pre-discharge review.          HISTORY OF PRESENT ILLNESS:  This is a 74 year-old man who came to [place] after a left total knee replacement.   He has had a prior right total knee replacement.    His medical issues include COPD, hypertension, and a history of dementia for which he has previously seen Neurology.    He has done well here.  He is up walking with a walker.  They have a walker at home from prior to this admission.    He was discharged on Xarelto for 2  weeks.  I think the Xarelto can probably stop at this point and he can be discharged on ASA.    PHYSICAL EXAMINATION:   MUSCULOSKELETAL:   EXTREMITIES:   LEFT LOWER EXTREMITY:  Left knee:  There is some mild inflammation, especially medially.  However, there is no evidence of infection here.  There is no effusion and no evidence of a DVT.    ASSESSMENT/PLAN:                             Status post left total knee replacement.  This is stable.  I think his Xarelto probably can be discontinued at this point.  I will discharge him on oxycodone 5 mg q.4 hours p.r.n., #30.    COPD.   This seems stable.     CPT CODE: 62836

## 2014-07-17 DIAGNOSIS — R2689 Other abnormalities of gait and mobility: Secondary | ICD-10-CM | POA: Diagnosis not present

## 2014-07-17 DIAGNOSIS — Z471 Aftercare following joint replacement surgery: Secondary | ICD-10-CM | POA: Diagnosis not present

## 2014-07-17 DIAGNOSIS — Z96652 Presence of left artificial knee joint: Secondary | ICD-10-CM | POA: Diagnosis not present

## 2014-07-18 DIAGNOSIS — Z96652 Presence of left artificial knee joint: Secondary | ICD-10-CM | POA: Diagnosis not present

## 2014-07-18 DIAGNOSIS — Z471 Aftercare following joint replacement surgery: Secondary | ICD-10-CM | POA: Diagnosis not present

## 2014-07-18 DIAGNOSIS — R2689 Other abnormalities of gait and mobility: Secondary | ICD-10-CM | POA: Diagnosis not present

## 2014-07-20 DIAGNOSIS — R2689 Other abnormalities of gait and mobility: Secondary | ICD-10-CM | POA: Diagnosis not present

## 2014-07-20 DIAGNOSIS — Z96652 Presence of left artificial knee joint: Secondary | ICD-10-CM | POA: Diagnosis not present

## 2014-07-20 DIAGNOSIS — Z471 Aftercare following joint replacement surgery: Secondary | ICD-10-CM | POA: Diagnosis not present

## 2014-07-21 DIAGNOSIS — R2689 Other abnormalities of gait and mobility: Secondary | ICD-10-CM | POA: Diagnosis not present

## 2014-07-21 DIAGNOSIS — Z96652 Presence of left artificial knee joint: Secondary | ICD-10-CM | POA: Diagnosis not present

## 2014-07-21 DIAGNOSIS — Z471 Aftercare following joint replacement surgery: Secondary | ICD-10-CM | POA: Diagnosis not present

## 2014-07-24 DIAGNOSIS — Z96652 Presence of left artificial knee joint: Secondary | ICD-10-CM | POA: Diagnosis not present

## 2014-07-24 DIAGNOSIS — Z471 Aftercare following joint replacement surgery: Secondary | ICD-10-CM | POA: Diagnosis not present

## 2014-07-24 DIAGNOSIS — R2689 Other abnormalities of gait and mobility: Secondary | ICD-10-CM | POA: Diagnosis not present

## 2014-07-25 DIAGNOSIS — Z96652 Presence of left artificial knee joint: Secondary | ICD-10-CM | POA: Diagnosis not present

## 2014-07-25 DIAGNOSIS — R2689 Other abnormalities of gait and mobility: Secondary | ICD-10-CM | POA: Diagnosis not present

## 2014-07-25 DIAGNOSIS — Z471 Aftercare following joint replacement surgery: Secondary | ICD-10-CM | POA: Diagnosis not present

## 2014-07-27 DIAGNOSIS — R2689 Other abnormalities of gait and mobility: Secondary | ICD-10-CM | POA: Diagnosis not present

## 2014-07-27 DIAGNOSIS — Z96652 Presence of left artificial knee joint: Secondary | ICD-10-CM | POA: Diagnosis not present

## 2014-07-27 DIAGNOSIS — Z471 Aftercare following joint replacement surgery: Secondary | ICD-10-CM | POA: Diagnosis not present

## 2014-07-31 DIAGNOSIS — Z471 Aftercare following joint replacement surgery: Secondary | ICD-10-CM | POA: Diagnosis not present

## 2014-07-31 DIAGNOSIS — Z96652 Presence of left artificial knee joint: Secondary | ICD-10-CM | POA: Diagnosis not present

## 2014-07-31 DIAGNOSIS — R2689 Other abnormalities of gait and mobility: Secondary | ICD-10-CM | POA: Diagnosis not present

## 2014-08-01 DIAGNOSIS — Z471 Aftercare following joint replacement surgery: Secondary | ICD-10-CM | POA: Diagnosis not present

## 2014-08-01 DIAGNOSIS — R2689 Other abnormalities of gait and mobility: Secondary | ICD-10-CM | POA: Diagnosis not present

## 2014-08-01 DIAGNOSIS — Z96652 Presence of left artificial knee joint: Secondary | ICD-10-CM | POA: Diagnosis not present

## 2014-08-02 DIAGNOSIS — Z471 Aftercare following joint replacement surgery: Secondary | ICD-10-CM | POA: Diagnosis not present

## 2014-08-02 DIAGNOSIS — Z96652 Presence of left artificial knee joint: Secondary | ICD-10-CM | POA: Diagnosis not present

## 2014-08-03 DIAGNOSIS — Z96652 Presence of left artificial knee joint: Secondary | ICD-10-CM | POA: Diagnosis not present

## 2014-08-03 DIAGNOSIS — R2689 Other abnormalities of gait and mobility: Secondary | ICD-10-CM | POA: Diagnosis not present

## 2014-08-03 DIAGNOSIS — Z471 Aftercare following joint replacement surgery: Secondary | ICD-10-CM | POA: Diagnosis not present

## 2014-08-07 DIAGNOSIS — Z471 Aftercare following joint replacement surgery: Secondary | ICD-10-CM | POA: Diagnosis not present

## 2014-08-07 DIAGNOSIS — R2689 Other abnormalities of gait and mobility: Secondary | ICD-10-CM | POA: Diagnosis not present

## 2014-08-07 DIAGNOSIS — Z96652 Presence of left artificial knee joint: Secondary | ICD-10-CM | POA: Diagnosis not present

## 2014-08-08 DIAGNOSIS — R2689 Other abnormalities of gait and mobility: Secondary | ICD-10-CM | POA: Diagnosis not present

## 2014-08-08 DIAGNOSIS — Z96652 Presence of left artificial knee joint: Secondary | ICD-10-CM | POA: Diagnosis not present

## 2014-08-08 DIAGNOSIS — Z471 Aftercare following joint replacement surgery: Secondary | ICD-10-CM | POA: Diagnosis not present

## 2014-08-10 DIAGNOSIS — R2689 Other abnormalities of gait and mobility: Secondary | ICD-10-CM | POA: Diagnosis not present

## 2014-08-10 DIAGNOSIS — Z471 Aftercare following joint replacement surgery: Secondary | ICD-10-CM | POA: Diagnosis not present

## 2014-08-10 DIAGNOSIS — Z96652 Presence of left artificial knee joint: Secondary | ICD-10-CM | POA: Diagnosis not present

## 2014-08-14 DIAGNOSIS — Z471 Aftercare following joint replacement surgery: Secondary | ICD-10-CM | POA: Diagnosis not present

## 2014-08-14 DIAGNOSIS — Z96652 Presence of left artificial knee joint: Secondary | ICD-10-CM | POA: Diagnosis not present

## 2014-08-14 DIAGNOSIS — R2689 Other abnormalities of gait and mobility: Secondary | ICD-10-CM | POA: Diagnosis not present

## 2014-08-15 ENCOUNTER — Ambulatory Visit: Payer: Medicare Other | Admitting: Neurology

## 2014-08-15 DIAGNOSIS — Z96652 Presence of left artificial knee joint: Secondary | ICD-10-CM | POA: Diagnosis not present

## 2014-08-15 DIAGNOSIS — Z471 Aftercare following joint replacement surgery: Secondary | ICD-10-CM | POA: Diagnosis not present

## 2014-08-15 DIAGNOSIS — R2689 Other abnormalities of gait and mobility: Secondary | ICD-10-CM | POA: Diagnosis not present

## 2014-08-17 DIAGNOSIS — Z96652 Presence of left artificial knee joint: Secondary | ICD-10-CM | POA: Diagnosis not present

## 2014-08-17 DIAGNOSIS — Z471 Aftercare following joint replacement surgery: Secondary | ICD-10-CM | POA: Diagnosis not present

## 2014-08-17 DIAGNOSIS — R2689 Other abnormalities of gait and mobility: Secondary | ICD-10-CM | POA: Diagnosis not present

## 2014-08-20 ENCOUNTER — Encounter: Payer: Self-pay | Admitting: Neurology

## 2014-08-20 ENCOUNTER — Ambulatory Visit (INDEPENDENT_AMBULATORY_CARE_PROVIDER_SITE_OTHER): Payer: Medicare Other | Admitting: Neurology

## 2014-08-20 VITALS — BP 142/82 | HR 88 | Resp 20 | Ht 68.0 in | Wt 188.0 lb

## 2014-08-20 DIAGNOSIS — F0280 Dementia in other diseases classified elsewhere without behavioral disturbance: Secondary | ICD-10-CM

## 2014-08-20 DIAGNOSIS — G309 Alzheimer's disease, unspecified: Secondary | ICD-10-CM

## 2014-08-20 MED ORDER — DONEPEZIL HCL 5 MG PO TABS: ORAL_TABLET | ORAL | Status: DC
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
## 2014-09-03 ENCOUNTER — Telehealth: Payer: Self-pay | Admitting: Neurology
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
## 2014-09-03 NOTE — Telephone Encounter (Signed)
Willie Turner called regarding donepezil (ARICEPT) 5 MG tablet. She was advised to call back if noticed any changes with this medication. Since going back on Aricept, within 1 week he starting acting the same way as before (very agitated, all of a sudden he doesn't want stepson around where as before he liked him and was fine with having him around. She took a trip to [place] with this son recently and that seems to have set him off. His brother stayed with him while she was gone). Call her on her cell phone.

## 2014-09-03 NOTE — Telephone Encounter (Signed)
Spoke to Dr. Vickey Huger. She agrees for pt to stop taking the aricept and continue the namenda for now.   I advised pt's wife of this, she voiced understanding, and to knows to call us back with any further questions or concerns.

## 2014-09-13 DIAGNOSIS — Z96652 Presence of left artificial knee joint: Secondary | ICD-10-CM | POA: Diagnosis not present

## 2014-09-13 DIAGNOSIS — Z471 Aftercare following joint replacement surgery: Secondary | ICD-10-CM | POA: Diagnosis not present

## 2014-09-18 ENCOUNTER — Other Ambulatory Visit: Payer: Self-pay | Admitting: Internal Medicine

## 2014-09-18 ENCOUNTER — Other Ambulatory Visit: Payer: Self-pay | Admitting: Neurology

## 2014-09-19 DIAGNOSIS — D649 Anemia, unspecified: Secondary | ICD-10-CM | POA: Diagnosis not present

## 2014-09-19 DIAGNOSIS — E538 Deficiency of other specified B group vitamins: Secondary | ICD-10-CM | POA: Diagnosis not present

## 2014-09-19 DIAGNOSIS — R7309 Other abnormal glucose: Secondary | ICD-10-CM | POA: Diagnosis not present

## 2014-09-19 DIAGNOSIS — Z6827 Body mass index (BMI) 27.0-27.9, adult: Secondary | ICD-10-CM | POA: Diagnosis not present

## 2014-09-19 DIAGNOSIS — F321 Major depressive disorder, single episode, moderate: Secondary | ICD-10-CM | POA: Diagnosis not present

## 2014-09-19 DIAGNOSIS — R5383 Other fatigue: Secondary | ICD-10-CM | POA: Diagnosis not present

## 2014-09-19 DIAGNOSIS — Z1389 Encounter for screening for other disorder: Secondary | ICD-10-CM | POA: Diagnosis not present

## 2014-09-19 DIAGNOSIS — F0390 Unspecified dementia without behavioral disturbance: Secondary | ICD-10-CM | POA: Diagnosis not present

## 2014-10-16 ENCOUNTER — Encounter (INDEPENDENT_AMBULATORY_CARE_PROVIDER_SITE_OTHER): Payer: Self-pay | Admitting: *Deleted

## 2014-10-18 DIAGNOSIS — D510 Vitamin B12 deficiency anemia due to intrinsic factor deficiency: Secondary | ICD-10-CM | POA: Diagnosis not present

## 2014-11-13 DIAGNOSIS — D510 Vitamin B12 deficiency anemia due to intrinsic factor deficiency: Secondary | ICD-10-CM | POA: Diagnosis not present

## 2014-11-13 DIAGNOSIS — Z23 Encounter for immunization: Secondary | ICD-10-CM | POA: Diagnosis not present

## 2014-12-11 DIAGNOSIS — Z471 Aftercare following joint replacement surgery: Secondary | ICD-10-CM | POA: Diagnosis not present

## 2014-12-11 DIAGNOSIS — Z96652 Presence of left artificial knee joint: Secondary | ICD-10-CM | POA: Diagnosis not present

## 2014-12-20 DIAGNOSIS — D649 Anemia, unspecified: Secondary | ICD-10-CM | POA: Diagnosis not present

## 2014-12-20 DIAGNOSIS — Z23 Encounter for immunization: Secondary | ICD-10-CM | POA: Diagnosis not present

## 2015-01-31 DIAGNOSIS — E538 Deficiency of other specified B group vitamins: Secondary | ICD-10-CM | POA: Diagnosis not present

## 2015-02-20 ENCOUNTER — Encounter: Payer: Self-pay | Admitting: Adult Health

## 2015-02-20 ENCOUNTER — Ambulatory Visit (INDEPENDENT_AMBULATORY_CARE_PROVIDER_SITE_OTHER): Payer: Medicare Other | Admitting: Adult Health

## 2015-02-20 VITALS — BP 135/89 | HR 82 | Ht 68.0 in | Wt 191.5 lb

## 2015-02-20 DIAGNOSIS — F0391 Unspecified dementia with behavioral disturbance: Secondary | ICD-10-CM

## 2015-02-20 DIAGNOSIS — F03918 Unspecified dementia, unspecified severity, with other behavioral disturbance: Secondary | ICD-10-CM

## 2015-02-20 MED ORDER — MEMANTINE HCL ER 28 MG PO CP24: 28.0000 mg | ORAL_CAPSULE | Freq: Every day | ORAL | Status: DC

## 2015-02-20 NOTE — Patient Instructions (Signed)
Memory score is stable Continue Namenda If your symptoms worsen or you develop new symptoms please let us know.   

## 2015-02-20 NOTE — Progress Notes (Signed)
I agree with the assessment and plan as directed by NP .The patient is known to me . Cognitive evaluation was performed and discussed.    Austan Nicholl, MD
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
## 2015-02-21 DIAGNOSIS — H5211 Myopia, right eye: Secondary | ICD-10-CM | POA: Diagnosis not present

## 2015-02-21 DIAGNOSIS — H52223 Regular astigmatism, bilateral: Secondary | ICD-10-CM | POA: Diagnosis not present

## 2015-02-21 DIAGNOSIS — H2513 Age-related nuclear cataract, bilateral: Secondary | ICD-10-CM | POA: Diagnosis not present

## 2015-02-21 DIAGNOSIS — H5202 Hypermetropia, left eye: Secondary | ICD-10-CM | POA: Diagnosis not present

## 2015-02-28 DIAGNOSIS — E538 Deficiency of other specified B group vitamins: Secondary | ICD-10-CM | POA: Diagnosis not present

## 2015-04-09 DIAGNOSIS — R5383 Other fatigue: Secondary | ICD-10-CM | POA: Diagnosis not present

## 2015-05-29 ENCOUNTER — Encounter (HOSPITAL_COMMUNITY): Payer: Self-pay

## 2015-05-29 ENCOUNTER — Emergency Department (HOSPITAL_COMMUNITY)
Admission: EM | Admit: 2015-05-29 | Discharge: 2015-05-30 | Disposition: A | Discharge status: Home / Self Care | Payer: Medicare Other | Attending: Emergency Medicine | Admitting: Emergency Medicine

## 2015-05-29 ENCOUNTER — Telehealth: Payer: Self-pay | Admitting: *Deleted

## 2015-05-29 ENCOUNTER — Emergency Department (HOSPITAL_COMMUNITY): Payer: Medicare Other

## 2015-05-29 DIAGNOSIS — R410 Disorientation, unspecified: Secondary | ICD-10-CM | POA: Diagnosis not present

## 2015-05-29 DIAGNOSIS — F329 Major depressive disorder, single episode, unspecified: Secondary | ICD-10-CM | POA: Insufficient documentation

## 2015-05-29 DIAGNOSIS — Z79899 Other long term (current) drug therapy: Secondary | ICD-10-CM | POA: Insufficient documentation

## 2015-05-29 DIAGNOSIS — I10 Essential (primary) hypertension: Secondary | ICD-10-CM | POA: Diagnosis not present

## 2015-05-29 DIAGNOSIS — F22 Delusional disorders: Secondary | ICD-10-CM | POA: Diagnosis not present

## 2015-05-29 DIAGNOSIS — Z87891 Personal history of nicotine dependence: Secondary | ICD-10-CM | POA: Diagnosis not present

## 2015-05-29 DIAGNOSIS — J45909 Unspecified asthma, uncomplicated: Secondary | ICD-10-CM | POA: Diagnosis not present

## 2015-05-29 DIAGNOSIS — F03918 Unspecified dementia, unspecified severity, with other behavioral disturbance: Secondary | ICD-10-CM | POA: Diagnosis present

## 2015-05-29 DIAGNOSIS — J449 Chronic obstructive pulmonary disease, unspecified: Secondary | ICD-10-CM | POA: Diagnosis not present

## 2015-05-29 DIAGNOSIS — F0391 Unspecified dementia with behavioral disturbance: Secondary | ICD-10-CM | POA: Diagnosis not present

## 2015-05-29 DIAGNOSIS — J4520 Mild intermittent asthma, uncomplicated: Secondary | ICD-10-CM | POA: Diagnosis not present

## 2015-05-29 LAB — RAPID URINE DRUG SCREEN, HOSP PERFORMED
Amphetamines: NOT DETECTED
BARBITURATES: NOT DETECTED
Benzodiazepines: NOT DETECTED
Cocaine: NOT DETECTED
Opiates: NOT DETECTED
Tetrahydrocannabinol: NOT DETECTED

## 2015-05-29 LAB — COMPREHENSIVE METABOLIC PANEL
ALT: 14 U/L — AB (ref 17–63)
ANION GAP: 9 (ref 5–15)
AST: 18 U/L (ref 15–41)
Albumin: 4.2 g/dL (ref 3.5–5.0)
Alkaline Phosphatase: 53 U/L (ref 38–126)
BILIRUBIN TOTAL: 0.6 mg/dL (ref 0.3–1.2)
BUN: 15 mg/dL (ref 6–20)
CALCIUM: 9.3 mg/dL (ref 8.9–10.3)
CO2: 26 mmol/L (ref 22–32)
CREATININE: 0.88 mg/dL (ref 0.61–1.24)
Chloride: 105 mmol/L (ref 101–111)
GFR calc non Af Amer: >60 mL/min (ref >60)
Glucose, Bld: 150 mg/dL — ABNORMAL HIGH (ref 65–99)
Potassium: 3.9 mmol/L (ref 3.5–5.1)
SODIUM: 140 mmol/L (ref 135–145)
TOTAL PROTEIN: 8 g/dL (ref 6.5–8.1)

## 2015-05-29 LAB — ETHANOL

## 2015-05-29 LAB — CBC WITH DIFFERENTIAL/PLATELET
BASOS ABS: 0 10*3/uL (ref 0.0–0.1)
Basophils Relative: 0 %
EOS PCT: 3 %
Eosinophils Absolute: 0.2 10*3/uL (ref 0.0–0.7)
HEMATOCRIT: 46.6 % (ref 39.0–52.0)
Hemoglobin: 15.6 g/dL (ref 13.0–17.0)
LYMPHS PCT: 21 %
Lymphs Abs: 1.5 10*3/uL (ref 0.7–4.0)
MCH: 29 pg (ref 26.0–34.0)
MCHC: 33.5 g/dL (ref 30.0–36.0)
MCV: 86.6 fL (ref 78.0–100.0)
MONOS PCT: 8 %
Monocytes Absolute: 0.5 10*3/uL (ref 0.1–1.0)
NEUTROS ABS: 4.8 10*3/uL (ref 1.7–7.7)
Neutrophils Relative %: 68 %
PLATELETS: 275 10*3/uL (ref 150–400)
RBC: 5.38 MIL/uL (ref 4.22–5.81)
RDW: 13.6 % (ref 11.5–15.5)
WBC: 7.1 10*3/uL (ref 4.0–10.5)

## 2015-05-29 LAB — ACETAMINOPHEN LEVEL: Acetaminophen (Tylenol), Serum: <10 ug/mL — ABNORMAL LOW (ref 10–30)

## 2015-05-29 LAB — URINE MICROSCOPIC-ADD ON

## 2015-05-29 LAB — URINALYSIS, ROUTINE W REFLEX MICROSCOPIC
BILIRUBIN URINE: NEGATIVE
Glucose, UA: NEGATIVE mg/dL
Ketones, ur: NEGATIVE mg/dL
Leukocytes, UA: NEGATIVE
Nitrite: NEGATIVE
Protein, ur: NEGATIVE mg/dL
Specific Gravity, Urine: 1.01 (ref 1.005–1.030)
pH: 6.5 (ref 5.0–8.0)

## 2015-05-29 LAB — TROPONIN I

## 2015-05-29 LAB — SALICYLATE LEVEL

## 2015-05-29 MED ORDER — ESCITALOPRAM OXALATE 10 MG PO TABS
10.0000 mg | ORAL_TABLET | Freq: Every day | ORAL | Status: DC
  Administered 2015-05-30: 10 mg via ORAL
  Filled 2015-05-29 (×2): qty 1

## 2015-05-29 MED ORDER — MEMANTINE HCL ER 28 MG PO CP24
28.0000 mg | ORAL_CAPSULE | Freq: Every day | ORAL | Status: DC
  Administered 2015-05-30: 28 mg via ORAL
  Filled 2015-05-29 (×2): qty 1

## 2015-05-29 MED ORDER — MELOXICAM 7.5 MG PO TABS
15.0000 mg | ORAL_TABLET | Freq: Every day | ORAL | Status: DC
  Administered 2015-05-30: 15 mg via ORAL
  Filled 2015-05-29 (×2): qty 1

## 2015-05-29 MED ORDER — MOMETASONE FURO-FORMOTEROL FUM 200-5 MCG/ACT IN AERO
INHALATION_SPRAY | RESPIRATORY_TRACT | Status: AC
  Filled 2015-05-29: qty 8.8

## 2015-05-29 MED ORDER — ALBUTEROL SULFATE HFA 108 (90 BASE) MCG/ACT IN AERS: 1.0000 | INHALATION_SPRAY | RESPIRATORY_TRACT | Status: DC | PRN

## 2015-05-29 MED ORDER — MOMETASONE FURO-FORMOTEROL FUM 200-5 MCG/ACT IN AERO
2.0000 | INHALATION_SPRAY | Freq: Two times a day (BID) | RESPIRATORY_TRACT | Status: DC
  Filled 2015-05-29: qty 8.8

## 2015-05-29 MED ORDER — PANTOPRAZOLE SODIUM 40 MG PO TBEC
40.0000 mg | DELAYED_RELEASE_TABLET | Freq: Every day | ORAL | Status: DC
  Administered 2015-05-30: 40 mg via ORAL
  Filled 2015-05-29: qty 1
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
## 2015-05-29 NOTE — ED Notes (Signed)
Declined his wife's request for visitation. Requesting NO INFORMATION be given to his wife or her son.
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
## 2015-05-29 NOTE — ED Notes (Signed)
Patient watch placed with belongings in EMS locker room.

## 2015-05-29 NOTE — ED Notes (Signed)
Pt given refreshments at this time.
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
## 2015-05-29 NOTE — ED Notes (Signed)
Patient aware a urine sample is needed states he will give one after while.

## 2015-05-30 DIAGNOSIS — F0391 Unspecified dementia with behavioral disturbance: Secondary | ICD-10-CM | POA: Diagnosis not present

## 2015-05-30 NOTE — ED Notes (Signed)
Pt's wife arrived and is at bedside to transport patient back home.

## 2015-05-30 NOTE — ED Notes (Signed)
Attempted to call family to check ETA with no response. Pt informed of delay.

## 2015-05-30 NOTE — ED Notes (Signed)
Spoke with pt's wife about disposition with permission from patient. States she is on the way to pick up patient.

## 2015-05-30 NOTE — ED Provider Notes (Signed)
Cleared by [facility] for discharge home.  Vanetta MuldersScott Sal Spratley, MD 05/30/15 [phone]

## 2015-05-30 NOTE — ED Notes (Signed)
IVC paperwork rescinded 

## 2015-05-30 NOTE — ED Notes (Signed)
Pt's wife called requesting update on patient. From previous RN, pt does not want any information released to wife or son at this time.
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
## 2015-05-30 NOTE — Discharge Instructions (Signed)
Cleared by [facility] for discharge home. They reminded you to make sure you take your medications. Return as needed.

## 2015-05-30 NOTE — ED Notes (Signed)
TTS consult in process. 

## 2015-05-31 DIAGNOSIS — F0390 Unspecified dementia without behavioral disturbance: Secondary | ICD-10-CM | POA: Diagnosis not present

## 2015-05-31 DIAGNOSIS — Z681 Body mass index (BMI) 19 or less, adult: Secondary | ICD-10-CM | POA: Diagnosis not present

## 2015-05-31 LAB — URINE CULTURE: CULTURE: NO GROWTH

## 2015-07-17 DIAGNOSIS — R5383 Other fatigue: Secondary | ICD-10-CM | POA: Diagnosis not present

## 2015-08-20 ENCOUNTER — Encounter: Payer: Self-pay | Admitting: Adult Health

## 2015-08-20 ENCOUNTER — Ambulatory Visit (INDEPENDENT_AMBULATORY_CARE_PROVIDER_SITE_OTHER): Payer: Medicare Other | Admitting: Adult Health

## 2015-08-20 VITALS — BP 136/78 | HR 64 | Ht 70.0 in | Wt 195.4 lb

## 2015-08-20 DIAGNOSIS — F0390 Unspecified dementia without behavioral disturbance: Secondary | ICD-10-CM | POA: Diagnosis not present

## 2015-08-20 NOTE — Progress Notes (Signed)
I agree with the assessment and plan as directed by NP .The patient is known to me .   Jersie Beel, MD  

## 2015-08-20 NOTE — Patient Instructions (Signed)
Memory score is stable Continue Namenda Continue Lexapro If your symptoms worsen or you develop new symptoms please let us know.
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
## 2015-11-06 DIAGNOSIS — Z23 Encounter for immunization: Secondary | ICD-10-CM | POA: Diagnosis not present

## 2015-11-28 DIAGNOSIS — Z96652 Presence of left artificial knee joint: Secondary | ICD-10-CM | POA: Diagnosis not present

## 2015-11-28 DIAGNOSIS — Z96651 Presence of right artificial knee joint: Secondary | ICD-10-CM | POA: Diagnosis not present

## 2015-12-04 DIAGNOSIS — R269 Unspecified abnormalities of gait and mobility: Secondary | ICD-10-CM | POA: Diagnosis not present

## 2015-12-06 DIAGNOSIS — R269 Unspecified abnormalities of gait and mobility: Secondary | ICD-10-CM | POA: Diagnosis not present

## 2015-12-09 DIAGNOSIS — R269 Unspecified abnormalities of gait and mobility: Secondary | ICD-10-CM | POA: Diagnosis not present

## 2015-12-11 DIAGNOSIS — R269 Unspecified abnormalities of gait and mobility: Secondary | ICD-10-CM | POA: Diagnosis not present

## 2015-12-16 DIAGNOSIS — R269 Unspecified abnormalities of gait and mobility: Secondary | ICD-10-CM | POA: Diagnosis not present

## 2015-12-19 DIAGNOSIS — R269 Unspecified abnormalities of gait and mobility: Secondary | ICD-10-CM | POA: Diagnosis not present

## 2015-12-23 DIAGNOSIS — R269 Unspecified abnormalities of gait and mobility: Secondary | ICD-10-CM | POA: Diagnosis not present

## 2015-12-25 DIAGNOSIS — R269 Unspecified abnormalities of gait and mobility: Secondary | ICD-10-CM | POA: Diagnosis not present

## 2015-12-26 DIAGNOSIS — J45909 Unspecified asthma, uncomplicated: Secondary | ICD-10-CM | POA: Diagnosis not present

## 2015-12-26 DIAGNOSIS — R5383 Other fatigue: Secondary | ICD-10-CM | POA: Diagnosis not present

## 2015-12-26 DIAGNOSIS — R7309 Other abnormal glucose: Secondary | ICD-10-CM | POA: Diagnosis not present

## 2015-12-26 DIAGNOSIS — Z23 Encounter for immunization: Secondary | ICD-10-CM | POA: Diagnosis not present

## 2015-12-26 DIAGNOSIS — Z6828 Body mass index (BMI) 28.0-28.9, adult: Secondary | ICD-10-CM | POA: Diagnosis not present

## 2015-12-26 DIAGNOSIS — E784 Other hyperlipidemia: Secondary | ICD-10-CM | POA: Diagnosis not present

## 2015-12-30 DIAGNOSIS — R269 Unspecified abnormalities of gait and mobility: Secondary | ICD-10-CM | POA: Diagnosis not present

## 2016-01-01 DIAGNOSIS — R269 Unspecified abnormalities of gait and mobility: Secondary | ICD-10-CM | POA: Diagnosis not present

## 2016-01-22 DIAGNOSIS — R269 Unspecified abnormalities of gait and mobility: Secondary | ICD-10-CM | POA: Diagnosis not present

## 2016-01-24 DIAGNOSIS — R269 Unspecified abnormalities of gait and mobility: Secondary | ICD-10-CM | POA: Diagnosis not present

## 2016-01-27 DIAGNOSIS — R269 Unspecified abnormalities of gait and mobility: Secondary | ICD-10-CM | POA: Diagnosis not present

## 2016-01-29 DIAGNOSIS — R269 Unspecified abnormalities of gait and mobility: Secondary | ICD-10-CM | POA: Diagnosis not present

## 2016-02-03 DIAGNOSIS — R269 Unspecified abnormalities of gait and mobility: Secondary | ICD-10-CM | POA: Diagnosis not present

## 2016-02-06 DIAGNOSIS — R269 Unspecified abnormalities of gait and mobility: Secondary | ICD-10-CM | POA: Diagnosis not present

## 2016-02-07 DIAGNOSIS — R269 Unspecified abnormalities of gait and mobility: Secondary | ICD-10-CM | POA: Diagnosis not present

## 2016-02-11 DIAGNOSIS — R269 Unspecified abnormalities of gait and mobility: Secondary | ICD-10-CM | POA: Diagnosis not present

## 2016-02-17 DIAGNOSIS — R269 Unspecified abnormalities of gait and mobility: Secondary | ICD-10-CM | POA: Diagnosis not present

## 2016-02-19 DIAGNOSIS — R269 Unspecified abnormalities of gait and mobility: Secondary | ICD-10-CM | POA: Diagnosis not present

## 2016-02-25 ENCOUNTER — Ambulatory Visit: Payer: Medicare Other | Admitting: Neurology

## 2016-02-25 DIAGNOSIS — R269 Unspecified abnormalities of gait and mobility: Secondary | ICD-10-CM | POA: Diagnosis not present

## 2016-02-26 ENCOUNTER — Telehealth: Payer: Self-pay | Admitting: Neurology

## 2016-02-26 NOTE — Telephone Encounter (Signed)
Pt's wife called wanting pt to be seen sooner. She said he had an appt yesterday but the provider was not in the office and it was r/s to 4/12. She is wanting pt to see Dr D. She advised the pt's dementia has gotten worse. Please call

## 2016-02-26 NOTE — Telephone Encounter (Signed)
I spoke to wife and offered her a 10am appt on 2/26 but she states patient is hard to wake that early. I advised her that I will call back if something else opens.

## 2016-02-26 NOTE — Telephone Encounter (Signed)
No openings as of right now. I will keep an eye out.

## 2016-02-27 DIAGNOSIS — R269 Unspecified abnormalities of gait and mobility: Secondary | ICD-10-CM | POA: Diagnosis not present

## 2016-02-28 DIAGNOSIS — R269 Unspecified abnormalities of gait and mobility: Secondary | ICD-10-CM | POA: Diagnosis not present

## 2016-03-02 DIAGNOSIS — R269 Unspecified abnormalities of gait and mobility: Secondary | ICD-10-CM | POA: Diagnosis not present

## 2016-03-04 NOTE — Telephone Encounter (Signed)
I called pt's wife. She just had cataract surgery and cannot take the pt today for an appt at 1:00pm with Dr. Vickey Hugerohmeier. Pt asked that we keep an eye out for further cancellations/sooner appt.

## 2016-03-05 NOTE — Telephone Encounter (Signed)
I spoke to wife and we were able to get patient in for an appt on Monday

## 2016-03-06 DIAGNOSIS — R269 Unspecified abnormalities of gait and mobility: Secondary | ICD-10-CM | POA: Diagnosis not present

## 2016-03-09 ENCOUNTER — Encounter: Payer: Self-pay | Admitting: Neurology

## 2016-03-09 ENCOUNTER — Ambulatory Visit (INDEPENDENT_AMBULATORY_CARE_PROVIDER_SITE_OTHER): Payer: Medicare Other | Admitting: Neurology

## 2016-03-09 VITALS — BP 112/60 | HR 72 | Resp 18 | Ht 70.0 in | Wt 194.0 lb

## 2016-03-09 DIAGNOSIS — F0281 Dementia in other diseases classified elsewhere with behavioral disturbance: Secondary | ICD-10-CM | POA: Diagnosis not present

## 2016-03-09 DIAGNOSIS — F1027 Alcohol dependence with alcohol-induced persisting dementia: Secondary | ICD-10-CM

## 2016-03-09 DIAGNOSIS — F0391 Unspecified dementia with behavioral disturbance: Secondary | ICD-10-CM | POA: Diagnosis not present

## 2016-03-09 DIAGNOSIS — F1097 Alcohol use, unspecified with alcohol-induced persisting dementia: Secondary | ICD-10-CM | POA: Diagnosis not present

## 2016-03-09 DIAGNOSIS — G308 Other Alzheimer's disease: Secondary | ICD-10-CM

## 2016-03-09 DIAGNOSIS — G309 Alzheimer's disease, unspecified: Secondary | ICD-10-CM

## 2016-03-09 MED ORDER — ESCITALOPRAM OXALATE 10 MG PO TABS: 10.0000 mg | ORAL_TABLET | Freq: Every day | ORAL | 4 refills | Status: AC

## 2016-03-09 MED ORDER — QUETIAPINE FUMARATE 25 MG PO TABS: ORAL_TABLET | ORAL | 1 refills | Status: DC
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
## 2016-03-11 DIAGNOSIS — R269 Unspecified abnormalities of gait and mobility: Secondary | ICD-10-CM | POA: Diagnosis not present

## 2016-04-08 ENCOUNTER — Ambulatory Visit (HOSPITAL_COMMUNITY)
Admission: RE | Admit: 2016-04-08 | Discharge: 2016-04-08 | Disposition: A | Discharge status: Home / Self Care | Payer: Medicare Other | Source: Ambulatory Visit | Attending: Family Medicine | Admitting: Family Medicine

## 2016-04-08 ENCOUNTER — Other Ambulatory Visit (HOSPITAL_COMMUNITY): Payer: Self-pay | Admitting: Family Medicine

## 2016-04-08 DIAGNOSIS — J9811 Atelectasis: Secondary | ICD-10-CM | POA: Diagnosis not present

## 2016-04-08 DIAGNOSIS — J45909 Unspecified asthma, uncomplicated: Secondary | ICD-10-CM

## 2016-04-08 DIAGNOSIS — J90 Pleural effusion, not elsewhere classified: Secondary | ICD-10-CM | POA: Diagnosis not present

## 2016-04-28 ENCOUNTER — Telehealth: Payer: Self-pay

## 2016-04-28 NOTE — Telephone Encounter (Signed)
PA started in CoverMyMeds. Key: [id number]   PA approved through 04/28/2017

## 2016-04-30 ENCOUNTER — Ambulatory Visit: Payer: Medicare Other | Admitting: Neurology

## 2016-05-24 ENCOUNTER — Encounter (HOSPITAL_COMMUNITY): Payer: Self-pay | Admitting: Emergency Medicine

## 2016-05-24 ENCOUNTER — Emergency Department (HOSPITAL_COMMUNITY)
Admission: EM | Admit: 2016-05-24 | Discharge: 2016-05-24 | Disposition: A | Discharge status: Home / Self Care | Payer: Medicare Other | Attending: Emergency Medicine | Admitting: Emergency Medicine

## 2016-05-24 DIAGNOSIS — J45909 Unspecified asthma, uncomplicated: Secondary | ICD-10-CM | POA: Insufficient documentation

## 2016-05-24 DIAGNOSIS — J449 Chronic obstructive pulmonary disease, unspecified: Secondary | ICD-10-CM | POA: Insufficient documentation

## 2016-05-24 DIAGNOSIS — Y92410 Unspecified street and highway as the place of occurrence of the external cause: Secondary | ICD-10-CM | POA: Insufficient documentation

## 2016-05-24 DIAGNOSIS — I10 Essential (primary) hypertension: Secondary | ICD-10-CM | POA: Insufficient documentation

## 2016-05-24 DIAGNOSIS — F0390 Unspecified dementia without behavioral disturbance: Secondary | ICD-10-CM | POA: Insufficient documentation

## 2016-05-24 DIAGNOSIS — M5489 Other dorsalgia: Secondary | ICD-10-CM | POA: Diagnosis not present

## 2016-05-24 DIAGNOSIS — Z87891 Personal history of nicotine dependence: Secondary | ICD-10-CM | POA: Diagnosis not present

## 2016-05-24 DIAGNOSIS — Z79899 Other long term (current) drug therapy: Secondary | ICD-10-CM | POA: Insufficient documentation

## 2016-05-24 DIAGNOSIS — S3992XA Unspecified injury of lower back, initial encounter: Secondary | ICD-10-CM | POA: Diagnosis present

## 2016-05-24 DIAGNOSIS — Y999 Unspecified external cause status: Secondary | ICD-10-CM | POA: Insufficient documentation

## 2016-05-24 DIAGNOSIS — Z743 Need for continuous supervision: Secondary | ICD-10-CM | POA: Diagnosis not present

## 2016-05-24 DIAGNOSIS — S39012A Strain of muscle, fascia and tendon of lower back, initial encounter: Secondary | ICD-10-CM

## 2016-05-24 DIAGNOSIS — Y939 Activity, unspecified: Secondary | ICD-10-CM | POA: Insufficient documentation

## 2016-05-24 DIAGNOSIS — R279 Unspecified lack of coordination: Secondary | ICD-10-CM | POA: Diagnosis not present

## 2016-05-24 LAB — URINALYSIS, ROUTINE W REFLEX MICROSCOPIC
BACTERIA UA: NONE SEEN
BILIRUBIN URINE: NEGATIVE
GLUCOSE, UA: NEGATIVE mg/dL
Ketones, ur: 20 mg/dL — AB
LEUKOCYTES UA: NEGATIVE
NITRITE: NEGATIVE
PROTEIN: NEGATIVE mg/dL
Specific Gravity, Urine: 1.018 (ref 1.005–1.030)
Squamous Epithelial / LPF: NONE SEEN
pH: 5 (ref 5.0–8.0)

## 2016-05-24 MED ORDER — KETOROLAC TROMETHAMINE 60 MG/2ML IM SOLN
60.0000 mg | Freq: Once | INTRAMUSCULAR | Status: AC
  Administered 2016-05-24: 60 mg via INTRAMUSCULAR
  Filled 2016-05-24: qty 2

## 2016-05-24 MED ORDER — METHOCARBAMOL 500 MG PO TABS: 500.0000 mg | ORAL_TABLET | Freq: Two times a day (BID) | ORAL | 0 refills | Status: DC | PRN

## 2016-05-24 MED ORDER — IBUPROFEN 600 MG PO TABS: 600.0000 mg | ORAL_TABLET | Freq: Four times a day (QID) | ORAL | 0 refills | Status: DC | PRN

## 2016-05-24 NOTE — ED Triage Notes (Signed)
Pt reports riding in the car with his wife last night and he turned around and had a sharp pain in his left flank area.  Pain worsens with movement or palpation.
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
## 2016-05-24 NOTE — ED Notes (Signed)
Gave pt. Heating pack per MD order

## 2016-05-24 NOTE — ED Notes (Signed)
ED Secretary to call EMS for transport back home. Pt and EDP aware.

## 2016-05-24 NOTE — Discharge Instructions (Signed)

## 2016-05-24 NOTE — ED Notes (Signed)
MD notified of unchanged pain

## 2016-05-26 DIAGNOSIS — M5410 Radiculopathy, site unspecified: Secondary | ICD-10-CM | POA: Diagnosis not present

## 2016-05-26 DIAGNOSIS — M545 Low back pain: Secondary | ICD-10-CM | POA: Diagnosis not present

## 2016-05-26 DIAGNOSIS — E663 Overweight: Secondary | ICD-10-CM | POA: Diagnosis not present

## 2016-05-26 DIAGNOSIS — Z6828 Body mass index (BMI) 28.0-28.9, adult: Secondary | ICD-10-CM | POA: Diagnosis not present

## 2016-06-08 ENCOUNTER — Ambulatory Visit: Payer: Medicare Other | Admitting: Adult Health

## 2016-06-19 ENCOUNTER — Other Ambulatory Visit: Payer: Self-pay | Admitting: Neurology

## 2016-06-19 DIAGNOSIS — G309 Alzheimer's disease, unspecified: Secondary | ICD-10-CM

## 2016-06-19 DIAGNOSIS — F1027 Alcohol dependence with alcohol-induced persisting dementia: Secondary | ICD-10-CM

## 2016-06-19 DIAGNOSIS — F0281 Dementia in other diseases classified elsewhere with behavioral disturbance: Secondary | ICD-10-CM

## 2016-07-02 ENCOUNTER — Telehealth: Payer: Self-pay | Admitting: Neurology

## 2016-07-02 NOTE — Telephone Encounter (Signed)
Pt's wife picked up RX for memantine (NAMENDA XR) 28 MG CP24 24 hr capsule, it was for generic he has always taken brand name. She is wanting to know if this is correct. Please call

## 2016-07-02 NOTE — Telephone Encounter (Signed)
I called and spoke to pts wife.   Relayed that it is ok to try the generic namenda 28mg .  (just turned to generic).  She will finish 2 wks of BRAND and then swtich to generic.

## 2016-07-03 NOTE — Telephone Encounter (Signed)
Noted  

## 2016-07-16 ENCOUNTER — Emergency Department (HOSPITAL_COMMUNITY)
Admission: EM | Admit: 2016-07-16 | Discharge: 2016-07-18 | Disposition: A | Discharge status: Other Acute Inpatient Hospital | Payer: Medicare Other | Attending: Emergency Medicine | Admitting: Emergency Medicine

## 2016-07-16 ENCOUNTER — Encounter (HOSPITAL_COMMUNITY): Payer: Self-pay | Admitting: *Deleted

## 2016-07-16 DIAGNOSIS — J449 Chronic obstructive pulmonary disease, unspecified: Secondary | ICD-10-CM | POA: Diagnosis not present

## 2016-07-16 DIAGNOSIS — Z87891 Personal history of nicotine dependence: Secondary | ICD-10-CM | POA: Insufficient documentation

## 2016-07-16 DIAGNOSIS — I10 Essential (primary) hypertension: Secondary | ICD-10-CM | POA: Insufficient documentation

## 2016-07-16 DIAGNOSIS — F0391 Unspecified dementia with behavioral disturbance: Secondary | ICD-10-CM | POA: Diagnosis not present

## 2016-07-16 DIAGNOSIS — Z79899 Other long term (current) drug therapy: Secondary | ICD-10-CM | POA: Diagnosis not present

## 2016-07-16 DIAGNOSIS — J45909 Unspecified asthma, uncomplicated: Secondary | ICD-10-CM | POA: Diagnosis not present

## 2016-07-16 DIAGNOSIS — R9431 Abnormal electrocardiogram [ECG] [EKG]: Secondary | ICD-10-CM | POA: Diagnosis not present

## 2016-07-16 DIAGNOSIS — Z751 Person awaiting admission to adequate facility elsewhere: Secondary | ICD-10-CM

## 2016-07-16 DIAGNOSIS — R451 Restlessness and agitation: Secondary | ICD-10-CM | POA: Diagnosis present

## 2016-07-16 LAB — COMPREHENSIVE METABOLIC PANEL
ALT: 16 U/L — ABNORMAL LOW (ref 17–63)
AST: 16 U/L (ref 15–41)
Albumin: 3.8 g/dL (ref 3.5–5.0)
Alkaline Phosphatase: 39 U/L (ref 38–126)
Anion gap: 6 (ref 5–15)
BUN: 18 mg/dL (ref 6–20)
CHLORIDE: 101 mmol/L (ref 101–111)
CO2: 29 mmol/L (ref 22–32)
Calcium: 9.3 mg/dL (ref 8.9–10.3)
Creatinine, Ser: 0.89 mg/dL (ref 0.61–1.24)
Glucose, Bld: 104 mg/dL — ABNORMAL HIGH (ref 65–99)
POTASSIUM: 4.2 mmol/L (ref 3.5–5.1)
Sodium: 136 mmol/L (ref 135–145)
Total Bilirubin: 0.7 mg/dL (ref 0.3–1.2)
Total Protein: 7.1 g/dL (ref 6.5–8.1)

## 2016-07-16 LAB — CBC WITH DIFFERENTIAL/PLATELET
Basophils Absolute: 0 10*3/uL (ref 0.0–0.1)
Basophils Relative: 0 %
EOS ABS: 0.2 10*3/uL (ref 0.0–0.7)
EOS PCT: 4 %
HCT: 43.3 % (ref 39.0–52.0)
HEMOGLOBIN: 14.3 g/dL (ref 13.0–17.0)
LYMPHS ABS: 1.6 10*3/uL (ref 0.7–4.0)
Lymphocytes Relative: 31 %
MCH: 28.8 pg (ref 26.0–34.0)
MCHC: 33 g/dL (ref 30.0–36.0)
MCV: 87.1 fL (ref 78.0–100.0)
MONOS PCT: 8 %
Monocytes Absolute: 0.4 10*3/uL (ref 0.1–1.0)
NEUTROS PCT: 57 %
Neutro Abs: 2.9 10*3/uL (ref 1.7–7.7)
Platelets: 237 10*3/uL (ref 150–400)
RBC: 4.97 MIL/uL (ref 4.22–5.81)
RDW: 13.4 % (ref 11.5–15.5)
WBC: 5.1 10*3/uL (ref 4.0–10.5)

## 2016-07-16 LAB — RAPID URINE DRUG SCREEN, HOSP PERFORMED
Amphetamines: NOT DETECTED
BENZODIAZEPINES: NOT DETECTED
Barbiturates: NOT DETECTED
Cocaine: NOT DETECTED
OPIATES: NOT DETECTED
Tetrahydrocannabinol: NOT DETECTED

## 2016-07-16 LAB — ETHANOL

## 2016-07-16 MED ORDER — PANTOPRAZOLE SODIUM 40 MG PO TBEC
40.0000 mg | DELAYED_RELEASE_TABLET | Freq: Every day | ORAL | Status: DC
  Administered 2016-07-16 – 2016-07-18 (×3): 40 mg via ORAL
  Filled 2016-07-16 (×2): qty 1

## 2016-07-16 MED ORDER — MEMANTINE HCL ER 28 MG PO CP24
28.0000 mg | ORAL_CAPSULE | Freq: Every day | ORAL | Status: DC
  Administered 2016-07-16 – 2016-07-18 (×3): 28 mg via ORAL
  Filled 2016-07-16 (×5): qty 1

## 2016-07-16 MED ORDER — MOMETASONE FURO-FORMOTEROL FUM 200-5 MCG/ACT IN AERO
2.0000 | INHALATION_SPRAY | Freq: Two times a day (BID) | RESPIRATORY_TRACT | Status: DC
  Administered 2016-07-16 – 2016-07-18 (×3): 2 via RESPIRATORY_TRACT
  Filled 2016-07-16 (×2): qty 8.8

## 2016-07-16 MED ORDER — QUETIAPINE FUMARATE 25 MG PO TABS
25.0000 mg | ORAL_TABLET | Freq: Every day | ORAL | Status: DC
  Administered 2016-07-16 – 2016-07-18 (×2): 25 mg via ORAL
  Filled 2016-07-16 (×4): qty 1

## 2016-07-16 MED ORDER — ESCITALOPRAM OXALATE 10 MG PO TABS
10.0000 mg | ORAL_TABLET | Freq: Every day | ORAL | Status: DC
  Administered 2016-07-16 – 2016-07-18 (×3): 10 mg via ORAL
  Filled 2016-07-16 (×6): qty 1

## 2016-07-16 MED ORDER — IBUPROFEN 400 MG PO TABS: 600.0000 mg | ORAL_TABLET | Freq: Four times a day (QID) | ORAL | Status: DC | PRN

## 2016-07-16 MED ORDER — QUETIAPINE FUMARATE 25 MG PO TABS
ORAL_TABLET | ORAL | Status: AC
  Filled 2016-07-16: qty 1

## 2016-07-16 MED ORDER — METHOCARBAMOL 500 MG PO TABS: 500.0000 mg | ORAL_TABLET | Freq: Two times a day (BID) | ORAL | Status: DC | PRN

## 2016-07-16 MED ORDER — ALBUTEROL SULFATE HFA 108 (90 BASE) MCG/ACT IN AERS: 1.0000 | INHALATION_SPRAY | RESPIRATORY_TRACT | Status: DC | PRN
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
## 2016-07-16 NOTE — ED Notes (Signed)
PATIENT'S CLOTHING AND WATCH STORED IN [employer] Rx[employer]ED LOCKER. SECURITY HAS WALLET AND $20 SECURED IN SAFE.

## 2016-07-17 ENCOUNTER — Emergency Department (HOSPITAL_COMMUNITY): Payer: Medicare Other

## 2016-07-17 DIAGNOSIS — J45909 Unspecified asthma, uncomplicated: Secondary | ICD-10-CM | POA: Diagnosis not present

## 2016-07-17 DIAGNOSIS — F0391 Unspecified dementia with behavioral disturbance: Secondary | ICD-10-CM | POA: Diagnosis not present

## 2016-07-17 NOTE — Progress Notes (Signed)
TTS faxed chest x-ray and EKG to [facility] for review.  Princess BruinsAquicha Katora Fini, MSW, LCSWA TTS Specialist [phone]
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
## 2016-07-17 NOTE — ED Notes (Signed)
Patient lying in bed sleeping at this time. Equal rise and fall of chest noted. Sitter at bedside. 

## 2016-07-17 NOTE — Progress Notes (Signed)
[facility] is considering pt for admission.  Disposition CSW spoke with ED Nurse, Neysa Bonitohristy, and requested EKG and Chest x-ray be ordered. Once completed, Disposition CSW will fax results to [facility].  Timmothy EulerJean T. Kaylyn LimSutter, MSW, LCSWA Clinical Social Work Disposition [phone]
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
## 2016-07-18 DIAGNOSIS — F0391 Unspecified dementia with behavioral disturbance: Secondary | ICD-10-CM | POA: Diagnosis not present

## 2016-07-18 NOTE — ED Provider Notes (Signed)
[facility]. Will transfer stable.    Samuel JesterMcManus, Mateus Rewerts, DO 07/18/16 [id number]

## 2016-07-18 NOTE — BH Assessment (Signed)
BHH Assessment Progress Note  [facility]. Can come anytime. Accepting provider is Dr. Joseph ArtSubedi. Room number is 412A. Call report to [phone]. Victorino [facility]Jennifer at [facility].  Johny ShockSamantha M. Ladona Ridgelaylor, MS, NCC, LPCA Counselor

## 2016-07-18 NOTE — BH Assessment (Addendum)
BHH Assessment Progress Note  Talked to Melissa at [facility] about the status of pt's referral. She has rec'd EKG and chest Xray, but now needs the IVC paperwork. Requested [facility] at [phone] via phone call to [facility]. [facility], once rec'd.   [facility] papers [facility]'d and [facility] [phone]) for review.   Johny ShockSamantha M. Ladona Ridgelaylor, MS, NCC, LPCA Counselor

## 2016-07-18 NOTE — ED Notes (Signed)
[facility] reported [facility] been [facility]. [facility] pt can arrive anytime. Number for report [phone], accepting Subedi, room # 412-A. Primary RN and EDP aware.

## 2016-07-18 NOTE — Progress Notes (Signed)
Call report phone number for [facility] is [phone], per intake Mellisa.  Melbourne Abtsatia Michoel Kunin, LCSWA Disposition staff 07/18/2016 9:50 AM

## 2016-07-18 NOTE — ED Notes (Signed)
Pt sleeping soundly at this time.  Even rise and fall of chest noted.  Gently tried to arouse pt, but due to report of not sleeping much last night will not disturb.  Breakfast tray left at bedside.  Sitter present.

## 2016-07-19 DIAGNOSIS — J449 Chronic obstructive pulmonary disease, unspecified: Secondary | ICD-10-CM | POA: Diagnosis not present

## 2016-07-19 DIAGNOSIS — I10 Essential (primary) hypertension: Secondary | ICD-10-CM | POA: Diagnosis not present

## 2016-07-19 DIAGNOSIS — F0391 Unspecified dementia with behavioral disturbance: Secondary | ICD-10-CM | POA: Diagnosis not present

## 2016-07-19 DIAGNOSIS — M1990 Unspecified osteoarthritis, unspecified site: Secondary | ICD-10-CM | POA: Diagnosis not present

## 2016-07-20 DIAGNOSIS — F0391 Unspecified dementia with behavioral disturbance: Secondary | ICD-10-CM | POA: Diagnosis not present

## 2016-07-21 ENCOUNTER — Ambulatory Visit: Payer: Medicare Other | Admitting: Adult Health

## 2016-07-21 DIAGNOSIS — F0391 Unspecified dementia with behavioral disturbance: Secondary | ICD-10-CM | POA: Diagnosis not present

## 2016-07-22 DIAGNOSIS — F0391 Unspecified dementia with behavioral disturbance: Secondary | ICD-10-CM | POA: Diagnosis not present

## 2016-07-23 DIAGNOSIS — F0391 Unspecified dementia with behavioral disturbance: Secondary | ICD-10-CM | POA: Diagnosis not present

## 2016-07-24 DIAGNOSIS — F0391 Unspecified dementia with behavioral disturbance: Secondary | ICD-10-CM | POA: Diagnosis not present

## 2016-07-25 DIAGNOSIS — F0391 Unspecified dementia with behavioral disturbance: Secondary | ICD-10-CM | POA: Diagnosis not present

## 2016-07-26 DIAGNOSIS — F0391 Unspecified dementia with behavioral disturbance: Secondary | ICD-10-CM | POA: Diagnosis not present

## 2016-07-27 DIAGNOSIS — F0391 Unspecified dementia with behavioral disturbance: Secondary | ICD-10-CM | POA: Diagnosis not present

## 2016-07-28 DIAGNOSIS — F0391 Unspecified dementia with behavioral disturbance: Secondary | ICD-10-CM | POA: Diagnosis not present

## 2016-07-29 DIAGNOSIS — F0391 Unspecified dementia with behavioral disturbance: Secondary | ICD-10-CM | POA: Diagnosis not present

## 2016-07-30 DIAGNOSIS — F0391 Unspecified dementia with behavioral disturbance: Secondary | ICD-10-CM | POA: Diagnosis not present

## 2016-07-31 DIAGNOSIS — F0391 Unspecified dementia with behavioral disturbance: Secondary | ICD-10-CM | POA: Diagnosis not present

## 2016-08-01 DIAGNOSIS — F0391 Unspecified dementia with behavioral disturbance: Secondary | ICD-10-CM | POA: Diagnosis not present

## 2016-08-02 DIAGNOSIS — E559 Vitamin D deficiency, unspecified: Secondary | ICD-10-CM | POA: Diagnosis not present

## 2016-08-02 DIAGNOSIS — I10 Essential (primary) hypertension: Secondary | ICD-10-CM | POA: Diagnosis not present

## 2016-08-02 DIAGNOSIS — F0391 Unspecified dementia with behavioral disturbance: Secondary | ICD-10-CM | POA: Diagnosis not present

## 2016-08-02 DIAGNOSIS — M1990 Unspecified osteoarthritis, unspecified site: Secondary | ICD-10-CM | POA: Diagnosis not present

## 2016-08-02 DIAGNOSIS — K219 Gastro-esophageal reflux disease without esophagitis: Secondary | ICD-10-CM | POA: Diagnosis not present

## 2016-08-02 DIAGNOSIS — J449 Chronic obstructive pulmonary disease, unspecified: Secondary | ICD-10-CM | POA: Diagnosis not present

## 2016-08-03 DIAGNOSIS — R4182 Altered mental status, unspecified: Secondary | ICD-10-CM | POA: Diagnosis not present

## 2016-08-03 DIAGNOSIS — F0390 Unspecified dementia without behavioral disturbance: Secondary | ICD-10-CM | POA: Diagnosis not present

## 2016-08-03 DIAGNOSIS — Z7409 Other reduced mobility: Secondary | ICD-10-CM | POA: Diagnosis not present

## 2016-08-03 DIAGNOSIS — F0391 Unspecified dementia with behavioral disturbance: Secondary | ICD-10-CM | POA: Diagnosis not present

## 2016-08-06 DIAGNOSIS — E559 Vitamin D deficiency, unspecified: Secondary | ICD-10-CM | POA: Diagnosis not present

## 2016-08-06 DIAGNOSIS — F329 Major depressive disorder, single episode, unspecified: Secondary | ICD-10-CM | POA: Diagnosis not present

## 2016-08-06 DIAGNOSIS — Z9181 History of falling: Secondary | ICD-10-CM | POA: Diagnosis not present

## 2016-08-06 DIAGNOSIS — F0281 Dementia in other diseases classified elsewhere with behavioral disturbance: Secondary | ICD-10-CM | POA: Diagnosis not present

## 2016-08-06 DIAGNOSIS — G3183 Dementia with Lewy bodies: Secondary | ICD-10-CM | POA: Diagnosis not present

## 2016-08-06 DIAGNOSIS — J449 Chronic obstructive pulmonary disease, unspecified: Secondary | ICD-10-CM | POA: Diagnosis not present

## 2016-08-06 DIAGNOSIS — M150 Primary generalized (osteo)arthritis: Secondary | ICD-10-CM | POA: Diagnosis not present

## 2016-08-06 DIAGNOSIS — F419 Anxiety disorder, unspecified: Secondary | ICD-10-CM | POA: Diagnosis not present

## 2016-08-06 DIAGNOSIS — I10 Essential (primary) hypertension: Secondary | ICD-10-CM | POA: Diagnosis not present

## 2016-08-10 DIAGNOSIS — E559 Vitamin D deficiency, unspecified: Secondary | ICD-10-CM | POA: Diagnosis not present

## 2016-08-10 DIAGNOSIS — F0281 Dementia in other diseases classified elsewhere with behavioral disturbance: Secondary | ICD-10-CM | POA: Diagnosis not present

## 2016-08-10 DIAGNOSIS — I10 Essential (primary) hypertension: Secondary | ICD-10-CM | POA: Diagnosis not present

## 2016-08-10 DIAGNOSIS — J449 Chronic obstructive pulmonary disease, unspecified: Secondary | ICD-10-CM | POA: Diagnosis not present

## 2016-08-10 DIAGNOSIS — G3183 Dementia with Lewy bodies: Secondary | ICD-10-CM | POA: Diagnosis not present

## 2016-08-10 DIAGNOSIS — M150 Primary generalized (osteo)arthritis: Secondary | ICD-10-CM | POA: Diagnosis not present

## 2016-08-12 DIAGNOSIS — E559 Vitamin D deficiency, unspecified: Secondary | ICD-10-CM | POA: Diagnosis not present

## 2016-08-12 DIAGNOSIS — J449 Chronic obstructive pulmonary disease, unspecified: Secondary | ICD-10-CM | POA: Diagnosis not present

## 2016-08-12 DIAGNOSIS — G3183 Dementia with Lewy bodies: Secondary | ICD-10-CM | POA: Diagnosis not present

## 2016-08-12 DIAGNOSIS — I10 Essential (primary) hypertension: Secondary | ICD-10-CM | POA: Diagnosis not present

## 2016-08-12 DIAGNOSIS — F0281 Dementia in other diseases classified elsewhere with behavioral disturbance: Secondary | ICD-10-CM | POA: Diagnosis not present

## 2016-08-12 DIAGNOSIS — M150 Primary generalized (osteo)arthritis: Secondary | ICD-10-CM | POA: Diagnosis not present

## 2016-08-17 DIAGNOSIS — G3183 Dementia with Lewy bodies: Secondary | ICD-10-CM | POA: Diagnosis not present

## 2016-08-17 DIAGNOSIS — E559 Vitamin D deficiency, unspecified: Secondary | ICD-10-CM | POA: Diagnosis not present

## 2016-08-17 DIAGNOSIS — J449 Chronic obstructive pulmonary disease, unspecified: Secondary | ICD-10-CM | POA: Diagnosis not present

## 2016-08-17 DIAGNOSIS — M150 Primary generalized (osteo)arthritis: Secondary | ICD-10-CM | POA: Diagnosis not present

## 2016-08-17 DIAGNOSIS — F0281 Dementia in other diseases classified elsewhere with behavioral disturbance: Secondary | ICD-10-CM | POA: Diagnosis not present

## 2016-08-17 DIAGNOSIS — I10 Essential (primary) hypertension: Secondary | ICD-10-CM | POA: Diagnosis not present

## 2016-08-18 DIAGNOSIS — R5383 Other fatigue: Secondary | ICD-10-CM | POA: Diagnosis not present

## 2016-08-18 DIAGNOSIS — F321 Major depressive disorder, single episode, moderate: Secondary | ICD-10-CM | POA: Diagnosis not present

## 2016-08-18 DIAGNOSIS — Z6828 Body mass index (BMI) 28.0-28.9, adult: Secondary | ICD-10-CM | POA: Diagnosis not present

## 2016-08-18 DIAGNOSIS — F0390 Unspecified dementia without behavioral disturbance: Secondary | ICD-10-CM | POA: Diagnosis not present

## 2016-08-20 DIAGNOSIS — I10 Essential (primary) hypertension: Secondary | ICD-10-CM | POA: Diagnosis not present

## 2016-08-20 DIAGNOSIS — M150 Primary generalized (osteo)arthritis: Secondary | ICD-10-CM | POA: Diagnosis not present

## 2016-08-20 DIAGNOSIS — J449 Chronic obstructive pulmonary disease, unspecified: Secondary | ICD-10-CM | POA: Diagnosis not present

## 2016-08-20 DIAGNOSIS — E559 Vitamin D deficiency, unspecified: Secondary | ICD-10-CM | POA: Diagnosis not present

## 2016-08-20 DIAGNOSIS — G3183 Dementia with Lewy bodies: Secondary | ICD-10-CM | POA: Diagnosis not present

## 2016-08-20 DIAGNOSIS — F0281 Dementia in other diseases classified elsewhere with behavioral disturbance: Secondary | ICD-10-CM | POA: Diagnosis not present

## 2016-08-24 DIAGNOSIS — F330 Major depressive disorder, recurrent, mild: Secondary | ICD-10-CM | POA: Diagnosis not present

## 2016-08-24 DIAGNOSIS — K219 Gastro-esophageal reflux disease without esophagitis: Secondary | ICD-10-CM | POA: Diagnosis not present

## 2016-08-24 DIAGNOSIS — F0391 Unspecified dementia with behavioral disturbance: Secondary | ICD-10-CM | POA: Diagnosis not present

## 2016-08-24 DIAGNOSIS — R52 Pain, unspecified: Secondary | ICD-10-CM | POA: Diagnosis not present

## 2016-08-24 DIAGNOSIS — J449 Chronic obstructive pulmonary disease, unspecified: Secondary | ICD-10-CM | POA: Diagnosis not present

## 2016-08-24 DIAGNOSIS — E559 Vitamin D deficiency, unspecified: Secondary | ICD-10-CM | POA: Diagnosis not present

## 2016-08-25 DIAGNOSIS — F0281 Dementia in other diseases classified elsewhere with behavioral disturbance: Secondary | ICD-10-CM | POA: Diagnosis not present

## 2016-08-25 DIAGNOSIS — M150 Primary generalized (osteo)arthritis: Secondary | ICD-10-CM | POA: Diagnosis not present

## 2016-08-25 DIAGNOSIS — I10 Essential (primary) hypertension: Secondary | ICD-10-CM | POA: Diagnosis not present

## 2016-08-25 DIAGNOSIS — G3183 Dementia with Lewy bodies: Secondary | ICD-10-CM | POA: Diagnosis not present

## 2016-08-25 DIAGNOSIS — J449 Chronic obstructive pulmonary disease, unspecified: Secondary | ICD-10-CM | POA: Diagnosis not present

## 2016-08-25 DIAGNOSIS — E559 Vitamin D deficiency, unspecified: Secondary | ICD-10-CM | POA: Diagnosis not present

## 2016-08-27 DIAGNOSIS — F0281 Dementia in other diseases classified elsewhere with behavioral disturbance: Secondary | ICD-10-CM | POA: Diagnosis not present

## 2016-08-27 DIAGNOSIS — E559 Vitamin D deficiency, unspecified: Secondary | ICD-10-CM | POA: Diagnosis not present

## 2016-08-27 DIAGNOSIS — M150 Primary generalized (osteo)arthritis: Secondary | ICD-10-CM | POA: Diagnosis not present

## 2016-08-27 DIAGNOSIS — I10 Essential (primary) hypertension: Secondary | ICD-10-CM | POA: Diagnosis not present

## 2016-08-27 DIAGNOSIS — J449 Chronic obstructive pulmonary disease, unspecified: Secondary | ICD-10-CM | POA: Diagnosis not present

## 2016-08-27 DIAGNOSIS — G3183 Dementia with Lewy bodies: Secondary | ICD-10-CM | POA: Diagnosis not present

## 2016-09-01 DIAGNOSIS — F330 Major depressive disorder, recurrent, mild: Secondary | ICD-10-CM | POA: Diagnosis not present

## 2016-09-01 DIAGNOSIS — F0391 Unspecified dementia with behavioral disturbance: Secondary | ICD-10-CM | POA: Diagnosis not present

## 2016-09-02 DIAGNOSIS — M150 Primary generalized (osteo)arthritis: Secondary | ICD-10-CM | POA: Diagnosis not present

## 2016-09-02 DIAGNOSIS — F0281 Dementia in other diseases classified elsewhere with behavioral disturbance: Secondary | ICD-10-CM | POA: Diagnosis not present

## 2016-09-02 DIAGNOSIS — J449 Chronic obstructive pulmonary disease, unspecified: Secondary | ICD-10-CM | POA: Diagnosis not present

## 2016-09-02 DIAGNOSIS — G3183 Dementia with Lewy bodies: Secondary | ICD-10-CM | POA: Diagnosis not present

## 2016-09-02 DIAGNOSIS — I10 Essential (primary) hypertension: Secondary | ICD-10-CM | POA: Diagnosis not present

## 2016-09-02 DIAGNOSIS — E559 Vitamin D deficiency, unspecified: Secondary | ICD-10-CM | POA: Diagnosis not present

## 2016-09-04 DIAGNOSIS — J449 Chronic obstructive pulmonary disease, unspecified: Secondary | ICD-10-CM | POA: Diagnosis not present

## 2016-09-04 DIAGNOSIS — E559 Vitamin D deficiency, unspecified: Secondary | ICD-10-CM | POA: Diagnosis not present

## 2016-09-04 DIAGNOSIS — F0281 Dementia in other diseases classified elsewhere with behavioral disturbance: Secondary | ICD-10-CM | POA: Diagnosis not present

## 2016-09-04 DIAGNOSIS — G3183 Dementia with Lewy bodies: Secondary | ICD-10-CM | POA: Diagnosis not present

## 2016-09-04 DIAGNOSIS — M150 Primary generalized (osteo)arthritis: Secondary | ICD-10-CM | POA: Diagnosis not present

## 2016-09-04 DIAGNOSIS — I10 Essential (primary) hypertension: Secondary | ICD-10-CM | POA: Diagnosis not present

## 2016-09-07 DIAGNOSIS — F0281 Dementia in other diseases classified elsewhere with behavioral disturbance: Secondary | ICD-10-CM | POA: Diagnosis not present

## 2016-09-07 DIAGNOSIS — G3183 Dementia with Lewy bodies: Secondary | ICD-10-CM | POA: Diagnosis not present

## 2016-09-07 DIAGNOSIS — J449 Chronic obstructive pulmonary disease, unspecified: Secondary | ICD-10-CM | POA: Diagnosis not present

## 2016-09-07 DIAGNOSIS — E559 Vitamin D deficiency, unspecified: Secondary | ICD-10-CM | POA: Diagnosis not present

## 2016-09-07 DIAGNOSIS — I10 Essential (primary) hypertension: Secondary | ICD-10-CM | POA: Diagnosis not present

## 2016-09-07 DIAGNOSIS — M150 Primary generalized (osteo)arthritis: Secondary | ICD-10-CM | POA: Diagnosis not present

## 2016-09-09 DIAGNOSIS — J449 Chronic obstructive pulmonary disease, unspecified: Secondary | ICD-10-CM | POA: Diagnosis not present

## 2016-09-09 DIAGNOSIS — F0281 Dementia in other diseases classified elsewhere with behavioral disturbance: Secondary | ICD-10-CM | POA: Diagnosis not present

## 2016-09-09 DIAGNOSIS — I10 Essential (primary) hypertension: Secondary | ICD-10-CM | POA: Diagnosis not present

## 2016-09-09 DIAGNOSIS — E559 Vitamin D deficiency, unspecified: Secondary | ICD-10-CM | POA: Diagnosis not present

## 2016-09-09 DIAGNOSIS — G3183 Dementia with Lewy bodies: Secondary | ICD-10-CM | POA: Diagnosis not present

## 2016-09-09 DIAGNOSIS — M150 Primary generalized (osteo)arthritis: Secondary | ICD-10-CM | POA: Diagnosis not present

## 2016-09-09 DIAGNOSIS — N390 Urinary tract infection, site not specified: Secondary | ICD-10-CM | POA: Diagnosis not present

## 2016-09-14 DIAGNOSIS — J441 Chronic obstructive pulmonary disease with (acute) exacerbation: Secondary | ICD-10-CM | POA: Diagnosis not present

## 2016-09-14 DIAGNOSIS — I10 Essential (primary) hypertension: Secondary | ICD-10-CM | POA: Diagnosis not present

## 2016-09-16 DIAGNOSIS — M150 Primary generalized (osteo)arthritis: Secondary | ICD-10-CM | POA: Diagnosis not present

## 2016-09-16 DIAGNOSIS — J449 Chronic obstructive pulmonary disease, unspecified: Secondary | ICD-10-CM | POA: Diagnosis not present

## 2016-09-16 DIAGNOSIS — E559 Vitamin D deficiency, unspecified: Secondary | ICD-10-CM | POA: Diagnosis not present

## 2016-09-16 DIAGNOSIS — G3183 Dementia with Lewy bodies: Secondary | ICD-10-CM | POA: Diagnosis not present

## 2016-09-16 DIAGNOSIS — I10 Essential (primary) hypertension: Secondary | ICD-10-CM | POA: Diagnosis not present

## 2016-09-16 DIAGNOSIS — F0281 Dementia in other diseases classified elsewhere with behavioral disturbance: Secondary | ICD-10-CM | POA: Diagnosis not present

## 2016-09-18 DIAGNOSIS — R918 Other nonspecific abnormal finding of lung field: Secondary | ICD-10-CM | POA: Diagnosis not present

## 2016-09-18 DIAGNOSIS — F4489 Other dissociative and conversion disorders: Secondary | ICD-10-CM | POA: Diagnosis not present

## 2016-09-18 DIAGNOSIS — I10 Essential (primary) hypertension: Secondary | ICD-10-CM | POA: Diagnosis not present

## 2016-09-18 DIAGNOSIS — E876 Hypokalemia: Secondary | ICD-10-CM | POA: Diagnosis not present

## 2016-09-18 DIAGNOSIS — I6789 Other cerebrovascular disease: Secondary | ICD-10-CM | POA: Diagnosis not present

## 2016-09-18 DIAGNOSIS — Z87891 Personal history of nicotine dependence: Secondary | ICD-10-CM | POA: Diagnosis not present

## 2016-09-18 DIAGNOSIS — R531 Weakness: Secondary | ICD-10-CM | POA: Diagnosis not present

## 2016-09-18 DIAGNOSIS — F0391 Unspecified dementia with behavioral disturbance: Secondary | ICD-10-CM | POA: Diagnosis not present

## 2016-09-18 DIAGNOSIS — F918 Other conduct disorders: Secondary | ICD-10-CM | POA: Diagnosis not present

## 2016-09-18 DIAGNOSIS — J449 Chronic obstructive pulmonary disease, unspecified: Secondary | ICD-10-CM | POA: Diagnosis not present

## 2016-09-18 DIAGNOSIS — Z66 Do not resuscitate: Secondary | ICD-10-CM | POA: Diagnosis not present

## 2016-09-18 DIAGNOSIS — R4182 Altered mental status, unspecified: Secondary | ICD-10-CM | POA: Diagnosis not present

## 2016-09-19 DIAGNOSIS — K219 Gastro-esophageal reflux disease without esophagitis: Secondary | ICD-10-CM | POA: Diagnosis present

## 2016-09-19 DIAGNOSIS — J449 Chronic obstructive pulmonary disease, unspecified: Secondary | ICD-10-CM | POA: Diagnosis present

## 2016-09-19 DIAGNOSIS — E559 Vitamin D deficiency, unspecified: Secondary | ICD-10-CM | POA: Diagnosis present

## 2016-09-19 DIAGNOSIS — Z87891 Personal history of nicotine dependence: Secondary | ICD-10-CM | POA: Diagnosis not present

## 2016-09-19 DIAGNOSIS — I10 Essential (primary) hypertension: Secondary | ICD-10-CM | POA: Diagnosis present

## 2016-09-19 DIAGNOSIS — M1990 Unspecified osteoarthritis, unspecified site: Secondary | ICD-10-CM | POA: Diagnosis present

## 2016-09-19 DIAGNOSIS — F0391 Unspecified dementia with behavioral disturbance: Secondary | ICD-10-CM | POA: Diagnosis present

## 2016-09-19 DIAGNOSIS — F0281 Dementia in other diseases classified elsewhere with behavioral disturbance: Secondary | ICD-10-CM | POA: Diagnosis not present

## 2016-09-19 DIAGNOSIS — Z7409 Other reduced mobility: Secondary | ICD-10-CM | POA: Diagnosis not present

## 2016-09-19 DIAGNOSIS — E876 Hypokalemia: Secondary | ICD-10-CM | POA: Diagnosis present

## 2016-09-19 DIAGNOSIS — F0390 Unspecified dementia without behavioral disturbance: Secondary | ICD-10-CM | POA: Diagnosis not present

## 2016-09-19 DIAGNOSIS — Z66 Do not resuscitate: Secondary | ICD-10-CM | POA: Diagnosis present

## 2016-09-19 DIAGNOSIS — Z79899 Other long term (current) drug therapy: Secondary | ICD-10-CM | POA: Diagnosis not present

## 2016-09-19 DIAGNOSIS — G309 Alzheimer's disease, unspecified: Secondary | ICD-10-CM | POA: Diagnosis not present

## 2016-10-02 DIAGNOSIS — Z96653 Presence of artificial knee joint, bilateral: Secondary | ICD-10-CM | POA: Diagnosis not present

## 2016-10-02 DIAGNOSIS — J449 Chronic obstructive pulmonary disease, unspecified: Secondary | ICD-10-CM | POA: Diagnosis not present

## 2016-10-02 DIAGNOSIS — F0391 Unspecified dementia with behavioral disturbance: Secondary | ICD-10-CM | POA: Diagnosis not present

## 2016-10-02 DIAGNOSIS — Z9181 History of falling: Secondary | ICD-10-CM | POA: Diagnosis not present

## 2016-10-02 DIAGNOSIS — R531 Weakness: Secondary | ICD-10-CM | POA: Diagnosis not present

## 2016-10-02 DIAGNOSIS — M1990 Unspecified osteoarthritis, unspecified site: Secondary | ICD-10-CM | POA: Diagnosis not present

## 2016-10-02 DIAGNOSIS — I10 Essential (primary) hypertension: Secondary | ICD-10-CM | POA: Diagnosis not present

## 2016-10-05 DIAGNOSIS — F0391 Unspecified dementia with behavioral disturbance: Secondary | ICD-10-CM | POA: Diagnosis not present

## 2016-10-05 DIAGNOSIS — R531 Weakness: Secondary | ICD-10-CM | POA: Diagnosis not present

## 2016-10-05 DIAGNOSIS — R2681 Unsteadiness on feet: Secondary | ICD-10-CM | POA: Diagnosis not present

## 2016-10-05 DIAGNOSIS — I10 Essential (primary) hypertension: Secondary | ICD-10-CM | POA: Diagnosis not present

## 2016-10-05 DIAGNOSIS — Z9181 History of falling: Secondary | ICD-10-CM | POA: Diagnosis not present

## 2016-10-05 DIAGNOSIS — R600 Localized edema: Secondary | ICD-10-CM | POA: Diagnosis not present

## 2016-10-05 DIAGNOSIS — Z96653 Presence of artificial knee joint, bilateral: Secondary | ICD-10-CM | POA: Diagnosis not present

## 2016-10-05 DIAGNOSIS — R296 Repeated falls: Secondary | ICD-10-CM | POA: Diagnosis not present

## 2016-10-05 DIAGNOSIS — M1990 Unspecified osteoarthritis, unspecified site: Secondary | ICD-10-CM | POA: Diagnosis not present

## 2016-10-08 DIAGNOSIS — M1990 Unspecified osteoarthritis, unspecified site: Secondary | ICD-10-CM | POA: Diagnosis not present

## 2016-10-08 DIAGNOSIS — I10 Essential (primary) hypertension: Secondary | ICD-10-CM | POA: Diagnosis not present

## 2016-10-08 DIAGNOSIS — F0391 Unspecified dementia with behavioral disturbance: Secondary | ICD-10-CM | POA: Diagnosis not present

## 2016-10-08 DIAGNOSIS — Z9181 History of falling: Secondary | ICD-10-CM | POA: Diagnosis not present

## 2016-10-08 DIAGNOSIS — R531 Weakness: Secondary | ICD-10-CM | POA: Diagnosis not present

## 2016-10-08 DIAGNOSIS — Z96653 Presence of artificial knee joint, bilateral: Secondary | ICD-10-CM | POA: Diagnosis not present

## 2016-10-12 DIAGNOSIS — I10 Essential (primary) hypertension: Secondary | ICD-10-CM | POA: Diagnosis not present

## 2016-10-12 DIAGNOSIS — Z96653 Presence of artificial knee joint, bilateral: Secondary | ICD-10-CM | POA: Diagnosis not present

## 2016-10-12 DIAGNOSIS — Z9181 History of falling: Secondary | ICD-10-CM | POA: Diagnosis not present

## 2016-10-12 DIAGNOSIS — F0391 Unspecified dementia with behavioral disturbance: Secondary | ICD-10-CM | POA: Diagnosis not present

## 2016-10-12 DIAGNOSIS — M1990 Unspecified osteoarthritis, unspecified site: Secondary | ICD-10-CM | POA: Diagnosis not present

## 2016-10-12 DIAGNOSIS — R531 Weakness: Secondary | ICD-10-CM | POA: Diagnosis not present

## 2016-10-15 DIAGNOSIS — I10 Essential (primary) hypertension: Secondary | ICD-10-CM | POA: Diagnosis not present

## 2016-10-15 DIAGNOSIS — M1990 Unspecified osteoarthritis, unspecified site: Secondary | ICD-10-CM | POA: Diagnosis not present

## 2016-10-15 DIAGNOSIS — Z9181 History of falling: Secondary | ICD-10-CM | POA: Diagnosis not present

## 2016-10-15 DIAGNOSIS — F0391 Unspecified dementia with behavioral disturbance: Secondary | ICD-10-CM | POA: Diagnosis not present

## 2016-10-15 DIAGNOSIS — Z96653 Presence of artificial knee joint, bilateral: Secondary | ICD-10-CM | POA: Diagnosis not present

## 2016-10-15 DIAGNOSIS — R531 Weakness: Secondary | ICD-10-CM | POA: Diagnosis not present

## 2016-10-16 DIAGNOSIS — Z23 Encounter for immunization: Secondary | ICD-10-CM | POA: Diagnosis not present

## 2016-10-19 DIAGNOSIS — F0391 Unspecified dementia with behavioral disturbance: Secondary | ICD-10-CM | POA: Diagnosis not present

## 2016-10-19 DIAGNOSIS — M1990 Unspecified osteoarthritis, unspecified site: Secondary | ICD-10-CM | POA: Diagnosis not present

## 2016-10-19 DIAGNOSIS — R05 Cough: Secondary | ICD-10-CM | POA: Diagnosis not present

## 2016-10-19 DIAGNOSIS — I10 Essential (primary) hypertension: Secondary | ICD-10-CM | POA: Diagnosis not present

## 2016-10-19 DIAGNOSIS — Z96653 Presence of artificial knee joint, bilateral: Secondary | ICD-10-CM | POA: Diagnosis not present

## 2016-10-19 DIAGNOSIS — R531 Weakness: Secondary | ICD-10-CM | POA: Diagnosis not present

## 2016-10-19 DIAGNOSIS — Z9181 History of falling: Secondary | ICD-10-CM | POA: Diagnosis not present

## 2016-10-21 ENCOUNTER — Ambulatory Visit: Payer: Medicare Other | Admitting: Adult Health

## 2016-10-21 DIAGNOSIS — Z96653 Presence of artificial knee joint, bilateral: Secondary | ICD-10-CM | POA: Diagnosis not present

## 2016-10-21 DIAGNOSIS — F0391 Unspecified dementia with behavioral disturbance: Secondary | ICD-10-CM | POA: Diagnosis not present

## 2016-10-21 DIAGNOSIS — R531 Weakness: Secondary | ICD-10-CM | POA: Diagnosis not present

## 2016-10-21 DIAGNOSIS — F330 Major depressive disorder, recurrent, mild: Secondary | ICD-10-CM | POA: Diagnosis not present

## 2016-10-21 DIAGNOSIS — I10 Essential (primary) hypertension: Secondary | ICD-10-CM | POA: Diagnosis not present

## 2016-10-21 DIAGNOSIS — Z9181 History of falling: Secondary | ICD-10-CM | POA: Diagnosis not present

## 2016-10-21 DIAGNOSIS — M1990 Unspecified osteoarthritis, unspecified site: Secondary | ICD-10-CM | POA: Diagnosis not present

## 2016-10-26 DIAGNOSIS — I10 Essential (primary) hypertension: Secondary | ICD-10-CM | POA: Diagnosis not present

## 2016-10-26 DIAGNOSIS — J069 Acute upper respiratory infection, unspecified: Secondary | ICD-10-CM | POA: Diagnosis not present

## 2016-10-26 DIAGNOSIS — M1990 Unspecified osteoarthritis, unspecified site: Secondary | ICD-10-CM | POA: Diagnosis not present

## 2016-10-26 DIAGNOSIS — F0391 Unspecified dementia with behavioral disturbance: Secondary | ICD-10-CM | POA: Diagnosis not present

## 2016-10-26 DIAGNOSIS — Z9181 History of falling: Secondary | ICD-10-CM | POA: Diagnosis not present

## 2016-10-26 DIAGNOSIS — R531 Weakness: Secondary | ICD-10-CM | POA: Diagnosis not present

## 2016-10-26 DIAGNOSIS — Z96653 Presence of artificial knee joint, bilateral: Secondary | ICD-10-CM | POA: Diagnosis not present

## 2016-10-29 DIAGNOSIS — Z96653 Presence of artificial knee joint, bilateral: Secondary | ICD-10-CM | POA: Diagnosis not present

## 2016-10-29 DIAGNOSIS — I10 Essential (primary) hypertension: Secondary | ICD-10-CM | POA: Diagnosis not present

## 2016-10-29 DIAGNOSIS — M1990 Unspecified osteoarthritis, unspecified site: Secondary | ICD-10-CM | POA: Diagnosis not present

## 2016-10-29 DIAGNOSIS — Z9181 History of falling: Secondary | ICD-10-CM | POA: Diagnosis not present

## 2016-10-29 DIAGNOSIS — F0391 Unspecified dementia with behavioral disturbance: Secondary | ICD-10-CM | POA: Diagnosis not present

## 2016-10-29 DIAGNOSIS — R531 Weakness: Secondary | ICD-10-CM | POA: Diagnosis not present

## 2016-11-02 DIAGNOSIS — M1990 Unspecified osteoarthritis, unspecified site: Secondary | ICD-10-CM | POA: Diagnosis not present

## 2016-11-02 DIAGNOSIS — R531 Weakness: Secondary | ICD-10-CM | POA: Diagnosis not present

## 2016-11-02 DIAGNOSIS — Z9181 History of falling: Secondary | ICD-10-CM | POA: Diagnosis not present

## 2016-11-02 DIAGNOSIS — Z96653 Presence of artificial knee joint, bilateral: Secondary | ICD-10-CM | POA: Diagnosis not present

## 2016-11-02 DIAGNOSIS — I10 Essential (primary) hypertension: Secondary | ICD-10-CM | POA: Diagnosis not present

## 2016-11-02 DIAGNOSIS — F0391 Unspecified dementia with behavioral disturbance: Secondary | ICD-10-CM | POA: Diagnosis not present

## 2016-11-04 DIAGNOSIS — I10 Essential (primary) hypertension: Secondary | ICD-10-CM | POA: Diagnosis not present

## 2016-11-04 DIAGNOSIS — Z5181 Encounter for therapeutic drug level monitoring: Secondary | ICD-10-CM | POA: Diagnosis not present

## 2016-11-04 DIAGNOSIS — R531 Weakness: Secondary | ICD-10-CM | POA: Diagnosis not present

## 2016-11-04 DIAGNOSIS — Z9181 History of falling: Secondary | ICD-10-CM | POA: Diagnosis not present

## 2016-11-04 DIAGNOSIS — F0391 Unspecified dementia with behavioral disturbance: Secondary | ICD-10-CM | POA: Diagnosis not present

## 2016-11-04 DIAGNOSIS — M1990 Unspecified osteoarthritis, unspecified site: Secondary | ICD-10-CM | POA: Diagnosis not present

## 2016-11-04 DIAGNOSIS — Z96653 Presence of artificial knee joint, bilateral: Secondary | ICD-10-CM | POA: Diagnosis not present

## 2016-11-06 DIAGNOSIS — F330 Major depressive disorder, recurrent, mild: Secondary | ICD-10-CM | POA: Diagnosis not present

## 2016-11-06 DIAGNOSIS — F0391 Unspecified dementia with behavioral disturbance: Secondary | ICD-10-CM | POA: Diagnosis not present

## 2016-11-09 DIAGNOSIS — R600 Localized edema: Secondary | ICD-10-CM | POA: Diagnosis not present

## 2016-11-09 DIAGNOSIS — E876 Hypokalemia: Secondary | ICD-10-CM | POA: Diagnosis not present

## 2016-11-09 DIAGNOSIS — J069 Acute upper respiratory infection, unspecified: Secondary | ICD-10-CM | POA: Diagnosis not present

## 2016-11-09 DIAGNOSIS — K219 Gastro-esophageal reflux disease without esophagitis: Secondary | ICD-10-CM | POA: Diagnosis not present

## 2016-11-10 DIAGNOSIS — Z96653 Presence of artificial knee joint, bilateral: Secondary | ICD-10-CM | POA: Diagnosis not present

## 2016-11-10 DIAGNOSIS — F0391 Unspecified dementia with behavioral disturbance: Secondary | ICD-10-CM | POA: Diagnosis not present

## 2016-11-10 DIAGNOSIS — R531 Weakness: Secondary | ICD-10-CM | POA: Diagnosis not present

## 2016-11-10 DIAGNOSIS — I10 Essential (primary) hypertension: Secondary | ICD-10-CM | POA: Diagnosis not present

## 2016-11-10 DIAGNOSIS — M1990 Unspecified osteoarthritis, unspecified site: Secondary | ICD-10-CM | POA: Diagnosis not present

## 2016-11-10 DIAGNOSIS — Z9181 History of falling: Secondary | ICD-10-CM | POA: Diagnosis not present

## 2016-11-11 DIAGNOSIS — Z9181 History of falling: Secondary | ICD-10-CM | POA: Diagnosis not present

## 2016-11-11 DIAGNOSIS — R531 Weakness: Secondary | ICD-10-CM | POA: Diagnosis not present

## 2016-11-11 DIAGNOSIS — Z96653 Presence of artificial knee joint, bilateral: Secondary | ICD-10-CM | POA: Diagnosis not present

## 2016-11-11 DIAGNOSIS — F0391 Unspecified dementia with behavioral disturbance: Secondary | ICD-10-CM | POA: Diagnosis not present

## 2016-11-11 DIAGNOSIS — I10 Essential (primary) hypertension: Secondary | ICD-10-CM | POA: Diagnosis not present

## 2016-11-11 DIAGNOSIS — M1990 Unspecified osteoarthritis, unspecified site: Secondary | ICD-10-CM | POA: Diagnosis not present

## 2016-11-12 DIAGNOSIS — I10 Essential (primary) hypertension: Secondary | ICD-10-CM | POA: Diagnosis not present

## 2016-11-12 DIAGNOSIS — R531 Weakness: Secondary | ICD-10-CM | POA: Diagnosis not present

## 2016-11-12 DIAGNOSIS — Z9181 History of falling: Secondary | ICD-10-CM | POA: Diagnosis not present

## 2016-11-12 DIAGNOSIS — F0391 Unspecified dementia with behavioral disturbance: Secondary | ICD-10-CM | POA: Diagnosis not present

## 2016-11-12 DIAGNOSIS — Z96653 Presence of artificial knee joint, bilateral: Secondary | ICD-10-CM | POA: Diagnosis not present

## 2016-11-12 DIAGNOSIS — M1990 Unspecified osteoarthritis, unspecified site: Secondary | ICD-10-CM | POA: Diagnosis not present

## 2016-11-13 DIAGNOSIS — F0391 Unspecified dementia with behavioral disturbance: Secondary | ICD-10-CM | POA: Diagnosis not present

## 2016-11-13 DIAGNOSIS — K219 Gastro-esophageal reflux disease without esophagitis: Secondary | ICD-10-CM | POA: Diagnosis not present

## 2016-11-13 DIAGNOSIS — F330 Major depressive disorder, recurrent, mild: Secondary | ICD-10-CM | POA: Diagnosis not present

## 2016-11-13 DIAGNOSIS — R52 Pain, unspecified: Secondary | ICD-10-CM | POA: Diagnosis not present

## 2016-11-13 DIAGNOSIS — E559 Vitamin D deficiency, unspecified: Secondary | ICD-10-CM | POA: Diagnosis not present

## 2016-11-13 DIAGNOSIS — J449 Chronic obstructive pulmonary disease, unspecified: Secondary | ICD-10-CM | POA: Diagnosis not present

## 2016-11-17 DIAGNOSIS — Z96653 Presence of artificial knee joint, bilateral: Secondary | ICD-10-CM | POA: Diagnosis not present

## 2016-11-17 DIAGNOSIS — M1990 Unspecified osteoarthritis, unspecified site: Secondary | ICD-10-CM | POA: Diagnosis not present

## 2016-11-17 DIAGNOSIS — F0391 Unspecified dementia with behavioral disturbance: Secondary | ICD-10-CM | POA: Diagnosis not present

## 2016-11-17 DIAGNOSIS — Z9181 History of falling: Secondary | ICD-10-CM | POA: Diagnosis not present

## 2016-11-17 DIAGNOSIS — I10 Essential (primary) hypertension: Secondary | ICD-10-CM | POA: Diagnosis not present

## 2016-11-17 DIAGNOSIS — R531 Weakness: Secondary | ICD-10-CM | POA: Diagnosis not present

## 2016-11-19 DIAGNOSIS — I10 Essential (primary) hypertension: Secondary | ICD-10-CM | POA: Diagnosis not present

## 2016-11-19 DIAGNOSIS — M1990 Unspecified osteoarthritis, unspecified site: Secondary | ICD-10-CM | POA: Diagnosis not present

## 2016-11-19 DIAGNOSIS — Z96653 Presence of artificial knee joint, bilateral: Secondary | ICD-10-CM | POA: Diagnosis not present

## 2016-11-19 DIAGNOSIS — R531 Weakness: Secondary | ICD-10-CM | POA: Diagnosis not present

## 2016-11-19 DIAGNOSIS — F0391 Unspecified dementia with behavioral disturbance: Secondary | ICD-10-CM | POA: Diagnosis not present

## 2016-11-19 DIAGNOSIS — Z9181 History of falling: Secondary | ICD-10-CM | POA: Diagnosis not present

## 2016-11-23 DIAGNOSIS — I10 Essential (primary) hypertension: Secondary | ICD-10-CM | POA: Diagnosis not present

## 2016-11-23 DIAGNOSIS — Z9181 History of falling: Secondary | ICD-10-CM | POA: Diagnosis not present

## 2016-11-23 DIAGNOSIS — M1990 Unspecified osteoarthritis, unspecified site: Secondary | ICD-10-CM | POA: Diagnosis not present

## 2016-11-23 DIAGNOSIS — R531 Weakness: Secondary | ICD-10-CM | POA: Diagnosis not present

## 2016-11-23 DIAGNOSIS — Z96653 Presence of artificial knee joint, bilateral: Secondary | ICD-10-CM | POA: Diagnosis not present

## 2016-11-23 DIAGNOSIS — F0391 Unspecified dementia with behavioral disturbance: Secondary | ICD-10-CM | POA: Diagnosis not present

## 2016-11-24 DIAGNOSIS — F0391 Unspecified dementia with behavioral disturbance: Secondary | ICD-10-CM | POA: Diagnosis not present

## 2016-11-24 DIAGNOSIS — M1990 Unspecified osteoarthritis, unspecified site: Secondary | ICD-10-CM | POA: Diagnosis not present

## 2016-11-24 DIAGNOSIS — Z9181 History of falling: Secondary | ICD-10-CM | POA: Diagnosis not present

## 2016-11-24 DIAGNOSIS — I10 Essential (primary) hypertension: Secondary | ICD-10-CM | POA: Diagnosis not present

## 2016-11-24 DIAGNOSIS — R531 Weakness: Secondary | ICD-10-CM | POA: Diagnosis not present

## 2016-11-24 DIAGNOSIS — Z96653 Presence of artificial knee joint, bilateral: Secondary | ICD-10-CM | POA: Diagnosis not present

## 2016-11-26 DIAGNOSIS — Z96653 Presence of artificial knee joint, bilateral: Secondary | ICD-10-CM | POA: Diagnosis not present

## 2016-11-26 DIAGNOSIS — Z9181 History of falling: Secondary | ICD-10-CM | POA: Diagnosis not present

## 2016-11-26 DIAGNOSIS — R531 Weakness: Secondary | ICD-10-CM | POA: Diagnosis not present

## 2016-11-26 DIAGNOSIS — M1990 Unspecified osteoarthritis, unspecified site: Secondary | ICD-10-CM | POA: Diagnosis not present

## 2016-11-26 DIAGNOSIS — F0391 Unspecified dementia with behavioral disturbance: Secondary | ICD-10-CM | POA: Diagnosis not present

## 2016-11-26 DIAGNOSIS — I10 Essential (primary) hypertension: Secondary | ICD-10-CM | POA: Diagnosis not present

## 2016-12-14 DIAGNOSIS — J069 Acute upper respiratory infection, unspecified: Secondary | ICD-10-CM | POA: Diagnosis not present

## 2016-12-14 DIAGNOSIS — R600 Localized edema: Secondary | ICD-10-CM | POA: Diagnosis not present

## 2016-12-14 DIAGNOSIS — J449 Chronic obstructive pulmonary disease, unspecified: Secondary | ICD-10-CM | POA: Diagnosis not present

## 2016-12-14 DIAGNOSIS — E559 Vitamin D deficiency, unspecified: Secondary | ICD-10-CM | POA: Diagnosis not present

## 2016-12-14 DIAGNOSIS — R52 Pain, unspecified: Secondary | ICD-10-CM | POA: Diagnosis not present

## 2016-12-14 DIAGNOSIS — K219 Gastro-esophageal reflux disease without esophagitis: Secondary | ICD-10-CM | POA: Diagnosis not present

## 2016-12-14 DIAGNOSIS — E876 Hypokalemia: Secondary | ICD-10-CM | POA: Diagnosis not present

## 2016-12-15 DIAGNOSIS — J449 Chronic obstructive pulmonary disease, unspecified: Secondary | ICD-10-CM | POA: Diagnosis not present

## 2016-12-15 DIAGNOSIS — E559 Vitamin D deficiency, unspecified: Secondary | ICD-10-CM | POA: Diagnosis not present

## 2016-12-15 DIAGNOSIS — K219 Gastro-esophageal reflux disease without esophagitis: Secondary | ICD-10-CM | POA: Diagnosis not present

## 2016-12-15 DIAGNOSIS — F330 Major depressive disorder, recurrent, mild: Secondary | ICD-10-CM | POA: Diagnosis not present

## 2016-12-15 DIAGNOSIS — F0391 Unspecified dementia with behavioral disturbance: Secondary | ICD-10-CM | POA: Diagnosis not present

## 2016-12-15 DIAGNOSIS — R52 Pain, unspecified: Secondary | ICD-10-CM | POA: Diagnosis not present

## 2016-12-21 DIAGNOSIS — R197 Diarrhea, unspecified: Secondary | ICD-10-CM | POA: Diagnosis not present

## 2017-01-06 DIAGNOSIS — E559 Vitamin D deficiency, unspecified: Secondary | ICD-10-CM | POA: Diagnosis not present

## 2017-01-06 DIAGNOSIS — E876 Hypokalemia: Secondary | ICD-10-CM | POA: Diagnosis not present

## 2017-01-14 DIAGNOSIS — F330 Major depressive disorder, recurrent, mild: Secondary | ICD-10-CM | POA: Diagnosis not present

## 2017-01-14 DIAGNOSIS — R52 Pain, unspecified: Secondary | ICD-10-CM | POA: Diagnosis not present

## 2017-01-14 DIAGNOSIS — J449 Chronic obstructive pulmonary disease, unspecified: Secondary | ICD-10-CM | POA: Diagnosis not present

## 2017-01-14 DIAGNOSIS — K219 Gastro-esophageal reflux disease without esophagitis: Secondary | ICD-10-CM | POA: Diagnosis not present

## 2017-01-14 DIAGNOSIS — F0391 Unspecified dementia with behavioral disturbance: Secondary | ICD-10-CM | POA: Diagnosis not present

## 2017-01-14 DIAGNOSIS — E559 Vitamin D deficiency, unspecified: Secondary | ICD-10-CM | POA: Diagnosis not present

## 2017-01-15 DIAGNOSIS — F330 Major depressive disorder, recurrent, mild: Secondary | ICD-10-CM | POA: Diagnosis not present

## 2017-01-15 DIAGNOSIS — F0151 Vascular dementia with behavioral disturbance: Secondary | ICD-10-CM | POA: Diagnosis not present

## 2017-01-15 DIAGNOSIS — F419 Anxiety disorder, unspecified: Secondary | ICD-10-CM | POA: Diagnosis not present

## 2017-01-18 DIAGNOSIS — N390 Urinary tract infection, site not specified: Secondary | ICD-10-CM | POA: Diagnosis not present

## 2017-01-20 DIAGNOSIS — F0151 Vascular dementia with behavioral disturbance: Secondary | ICD-10-CM | POA: Diagnosis not present

## 2017-01-20 DIAGNOSIS — F330 Major depressive disorder, recurrent, mild: Secondary | ICD-10-CM | POA: Diagnosis not present

## 2017-01-21 DIAGNOSIS — Z7951 Long term (current) use of inhaled steroids: Secondary | ICD-10-CM | POA: Diagnosis not present

## 2017-01-21 DIAGNOSIS — M1990 Unspecified osteoarthritis, unspecified site: Secondary | ICD-10-CM | POA: Diagnosis not present

## 2017-01-21 DIAGNOSIS — Z96653 Presence of artificial knee joint, bilateral: Secondary | ICD-10-CM | POA: Diagnosis not present

## 2017-01-21 DIAGNOSIS — Z9181 History of falling: Secondary | ICD-10-CM | POA: Diagnosis not present

## 2017-01-21 DIAGNOSIS — M6281 Muscle weakness (generalized): Secondary | ICD-10-CM | POA: Diagnosis not present

## 2017-01-21 DIAGNOSIS — J449 Chronic obstructive pulmonary disease, unspecified: Secondary | ICD-10-CM | POA: Diagnosis not present

## 2017-01-21 DIAGNOSIS — L89322 Pressure ulcer of left buttock, stage 2: Secondary | ICD-10-CM | POA: Diagnosis not present

## 2017-01-21 DIAGNOSIS — I10 Essential (primary) hypertension: Secondary | ICD-10-CM | POA: Diagnosis not present

## 2017-01-21 DIAGNOSIS — F0391 Unspecified dementia with behavioral disturbance: Secondary | ICD-10-CM | POA: Diagnosis not present

## 2017-01-22 DIAGNOSIS — F0391 Unspecified dementia with behavioral disturbance: Secondary | ICD-10-CM | POA: Diagnosis not present

## 2017-01-22 DIAGNOSIS — M6281 Muscle weakness (generalized): Secondary | ICD-10-CM | POA: Diagnosis not present

## 2017-01-22 DIAGNOSIS — M1990 Unspecified osteoarthritis, unspecified site: Secondary | ICD-10-CM | POA: Diagnosis not present

## 2017-01-22 DIAGNOSIS — J449 Chronic obstructive pulmonary disease, unspecified: Secondary | ICD-10-CM | POA: Diagnosis not present

## 2017-01-22 DIAGNOSIS — I10 Essential (primary) hypertension: Secondary | ICD-10-CM | POA: Diagnosis not present

## 2017-01-22 DIAGNOSIS — L89322 Pressure ulcer of left buttock, stage 2: Secondary | ICD-10-CM | POA: Diagnosis not present

## 2017-01-25 DIAGNOSIS — M1990 Unspecified osteoarthritis, unspecified site: Secondary | ICD-10-CM | POA: Diagnosis not present

## 2017-01-25 DIAGNOSIS — L89322 Pressure ulcer of left buttock, stage 2: Secondary | ICD-10-CM | POA: Diagnosis not present

## 2017-01-25 DIAGNOSIS — F0391 Unspecified dementia with behavioral disturbance: Secondary | ICD-10-CM | POA: Diagnosis not present

## 2017-01-25 DIAGNOSIS — I10 Essential (primary) hypertension: Secondary | ICD-10-CM | POA: Diagnosis not present

## 2017-01-25 DIAGNOSIS — M6281 Muscle weakness (generalized): Secondary | ICD-10-CM | POA: Diagnosis not present

## 2017-01-25 DIAGNOSIS — J449 Chronic obstructive pulmonary disease, unspecified: Secondary | ICD-10-CM | POA: Diagnosis not present

## 2017-01-27 DIAGNOSIS — J449 Chronic obstructive pulmonary disease, unspecified: Secondary | ICD-10-CM | POA: Diagnosis not present

## 2017-01-27 DIAGNOSIS — F0391 Unspecified dementia with behavioral disturbance: Secondary | ICD-10-CM | POA: Diagnosis not present

## 2017-01-27 DIAGNOSIS — M1990 Unspecified osteoarthritis, unspecified site: Secondary | ICD-10-CM | POA: Diagnosis not present

## 2017-01-27 DIAGNOSIS — L89322 Pressure ulcer of left buttock, stage 2: Secondary | ICD-10-CM | POA: Diagnosis not present

## 2017-01-27 DIAGNOSIS — I10 Essential (primary) hypertension: Secondary | ICD-10-CM | POA: Diagnosis not present

## 2017-01-27 DIAGNOSIS — M6281 Muscle weakness (generalized): Secondary | ICD-10-CM | POA: Diagnosis not present

## 2017-01-28 DIAGNOSIS — F0391 Unspecified dementia with behavioral disturbance: Secondary | ICD-10-CM | POA: Diagnosis not present

## 2017-01-28 DIAGNOSIS — L89322 Pressure ulcer of left buttock, stage 2: Secondary | ICD-10-CM | POA: Diagnosis not present

## 2017-01-28 DIAGNOSIS — J449 Chronic obstructive pulmonary disease, unspecified: Secondary | ICD-10-CM | POA: Diagnosis not present

## 2017-01-28 DIAGNOSIS — M6281 Muscle weakness (generalized): Secondary | ICD-10-CM | POA: Diagnosis not present

## 2017-01-28 DIAGNOSIS — M1990 Unspecified osteoarthritis, unspecified site: Secondary | ICD-10-CM | POA: Diagnosis not present

## 2017-01-28 DIAGNOSIS — I10 Essential (primary) hypertension: Secondary | ICD-10-CM | POA: Diagnosis not present

## 2017-01-29 DIAGNOSIS — F0151 Vascular dementia with behavioral disturbance: Secondary | ICD-10-CM | POA: Diagnosis not present

## 2017-01-29 DIAGNOSIS — F419 Anxiety disorder, unspecified: Secondary | ICD-10-CM | POA: Diagnosis not present

## 2017-01-29 DIAGNOSIS — F330 Major depressive disorder, recurrent, mild: Secondary | ICD-10-CM | POA: Diagnosis not present

## 2017-02-01 DIAGNOSIS — M6281 Muscle weakness (generalized): Secondary | ICD-10-CM | POA: Diagnosis not present

## 2017-02-01 DIAGNOSIS — L89322 Pressure ulcer of left buttock, stage 2: Secondary | ICD-10-CM | POA: Diagnosis not present

## 2017-02-01 DIAGNOSIS — F0391 Unspecified dementia with behavioral disturbance: Secondary | ICD-10-CM | POA: Diagnosis not present

## 2017-02-01 DIAGNOSIS — J449 Chronic obstructive pulmonary disease, unspecified: Secondary | ICD-10-CM | POA: Diagnosis not present

## 2017-02-01 DIAGNOSIS — M1990 Unspecified osteoarthritis, unspecified site: Secondary | ICD-10-CM | POA: Diagnosis not present

## 2017-02-01 DIAGNOSIS — I10 Essential (primary) hypertension: Secondary | ICD-10-CM | POA: Diagnosis not present

## 2017-02-04 DIAGNOSIS — F0391 Unspecified dementia with behavioral disturbance: Secondary | ICD-10-CM | POA: Diagnosis not present

## 2017-02-04 DIAGNOSIS — J449 Chronic obstructive pulmonary disease, unspecified: Secondary | ICD-10-CM | POA: Diagnosis not present

## 2017-02-04 DIAGNOSIS — I10 Essential (primary) hypertension: Secondary | ICD-10-CM | POA: Diagnosis not present

## 2017-02-04 DIAGNOSIS — M1990 Unspecified osteoarthritis, unspecified site: Secondary | ICD-10-CM | POA: Diagnosis not present

## 2017-02-04 DIAGNOSIS — L89322 Pressure ulcer of left buttock, stage 2: Secondary | ICD-10-CM | POA: Diagnosis not present

## 2017-02-04 DIAGNOSIS — M6281 Muscle weakness (generalized): Secondary | ICD-10-CM | POA: Diagnosis not present

## 2017-02-08 DIAGNOSIS — M1990 Unspecified osteoarthritis, unspecified site: Secondary | ICD-10-CM | POA: Diagnosis not present

## 2017-02-08 DIAGNOSIS — J449 Chronic obstructive pulmonary disease, unspecified: Secondary | ICD-10-CM | POA: Diagnosis not present

## 2017-02-08 DIAGNOSIS — M6281 Muscle weakness (generalized): Secondary | ICD-10-CM | POA: Diagnosis not present

## 2017-02-08 DIAGNOSIS — F0391 Unspecified dementia with behavioral disturbance: Secondary | ICD-10-CM | POA: Diagnosis not present

## 2017-02-08 DIAGNOSIS — I10 Essential (primary) hypertension: Secondary | ICD-10-CM | POA: Diagnosis not present

## 2017-02-08 DIAGNOSIS — L89322 Pressure ulcer of left buttock, stage 2: Secondary | ICD-10-CM | POA: Diagnosis not present

## 2017-02-10 DIAGNOSIS — M1990 Unspecified osteoarthritis, unspecified site: Secondary | ICD-10-CM | POA: Diagnosis not present

## 2017-02-10 DIAGNOSIS — F0391 Unspecified dementia with behavioral disturbance: Secondary | ICD-10-CM | POA: Diagnosis not present

## 2017-02-10 DIAGNOSIS — L89322 Pressure ulcer of left buttock, stage 2: Secondary | ICD-10-CM | POA: Diagnosis not present

## 2017-02-10 DIAGNOSIS — J449 Chronic obstructive pulmonary disease, unspecified: Secondary | ICD-10-CM | POA: Diagnosis not present

## 2017-02-10 DIAGNOSIS — I10 Essential (primary) hypertension: Secondary | ICD-10-CM | POA: Diagnosis not present

## 2017-02-10 DIAGNOSIS — M6281 Muscle weakness (generalized): Secondary | ICD-10-CM | POA: Diagnosis not present

## 2017-02-11 DIAGNOSIS — M6281 Muscle weakness (generalized): Secondary | ICD-10-CM | POA: Diagnosis not present

## 2017-02-11 DIAGNOSIS — L89322 Pressure ulcer of left buttock, stage 2: Secondary | ICD-10-CM | POA: Diagnosis not present

## 2017-02-11 DIAGNOSIS — F0391 Unspecified dementia with behavioral disturbance: Secondary | ICD-10-CM | POA: Diagnosis not present

## 2017-02-11 DIAGNOSIS — I10 Essential (primary) hypertension: Secondary | ICD-10-CM | POA: Diagnosis not present

## 2017-02-11 DIAGNOSIS — J449 Chronic obstructive pulmonary disease, unspecified: Secondary | ICD-10-CM | POA: Diagnosis not present

## 2017-02-11 DIAGNOSIS — M1990 Unspecified osteoarthritis, unspecified site: Secondary | ICD-10-CM | POA: Diagnosis not present

## 2017-02-12 DIAGNOSIS — F0391 Unspecified dementia with behavioral disturbance: Secondary | ICD-10-CM | POA: Diagnosis not present

## 2017-02-12 DIAGNOSIS — L89322 Pressure ulcer of left buttock, stage 2: Secondary | ICD-10-CM | POA: Diagnosis not present

## 2017-02-12 DIAGNOSIS — M6281 Muscle weakness (generalized): Secondary | ICD-10-CM | POA: Diagnosis not present

## 2017-02-12 DIAGNOSIS — I10 Essential (primary) hypertension: Secondary | ICD-10-CM | POA: Diagnosis not present

## 2017-02-12 DIAGNOSIS — J449 Chronic obstructive pulmonary disease, unspecified: Secondary | ICD-10-CM | POA: Diagnosis not present

## 2017-02-12 DIAGNOSIS — M1990 Unspecified osteoarthritis, unspecified site: Secondary | ICD-10-CM | POA: Diagnosis not present

## 2017-02-15 DIAGNOSIS — M1990 Unspecified osteoarthritis, unspecified site: Secondary | ICD-10-CM | POA: Diagnosis not present

## 2017-02-15 DIAGNOSIS — F0391 Unspecified dementia with behavioral disturbance: Secondary | ICD-10-CM | POA: Diagnosis not present

## 2017-02-15 DIAGNOSIS — J449 Chronic obstructive pulmonary disease, unspecified: Secondary | ICD-10-CM | POA: Diagnosis not present

## 2017-02-15 DIAGNOSIS — I10 Essential (primary) hypertension: Secondary | ICD-10-CM | POA: Diagnosis not present

## 2017-02-15 DIAGNOSIS — M6281 Muscle weakness (generalized): Secondary | ICD-10-CM | POA: Diagnosis not present

## 2017-02-15 DIAGNOSIS — Z0000 Encounter for general adult medical examination without abnormal findings: Secondary | ICD-10-CM | POA: Diagnosis not present

## 2017-02-15 DIAGNOSIS — L89322 Pressure ulcer of left buttock, stage 2: Secondary | ICD-10-CM | POA: Diagnosis not present

## 2017-02-17 DIAGNOSIS — F0391 Unspecified dementia with behavioral disturbance: Secondary | ICD-10-CM | POA: Diagnosis not present

## 2017-02-17 DIAGNOSIS — I10 Essential (primary) hypertension: Secondary | ICD-10-CM | POA: Diagnosis not present

## 2017-02-17 DIAGNOSIS — J449 Chronic obstructive pulmonary disease, unspecified: Secondary | ICD-10-CM | POA: Diagnosis not present

## 2017-02-17 DIAGNOSIS — L89322 Pressure ulcer of left buttock, stage 2: Secondary | ICD-10-CM | POA: Diagnosis not present

## 2017-02-17 DIAGNOSIS — M1990 Unspecified osteoarthritis, unspecified site: Secondary | ICD-10-CM | POA: Diagnosis not present

## 2017-02-17 DIAGNOSIS — M6281 Muscle weakness (generalized): Secondary | ICD-10-CM | POA: Diagnosis not present

## 2017-02-18 DIAGNOSIS — L89322 Pressure ulcer of left buttock, stage 2: Secondary | ICD-10-CM | POA: Diagnosis not present

## 2017-02-18 DIAGNOSIS — E876 Hypokalemia: Secondary | ICD-10-CM | POA: Diagnosis not present

## 2017-02-18 DIAGNOSIS — M6281 Muscle weakness (generalized): Secondary | ICD-10-CM | POA: Diagnosis not present

## 2017-02-18 DIAGNOSIS — F330 Major depressive disorder, recurrent, mild: Secondary | ICD-10-CM | POA: Diagnosis not present

## 2017-02-18 DIAGNOSIS — J449 Chronic obstructive pulmonary disease, unspecified: Secondary | ICD-10-CM | POA: Diagnosis not present

## 2017-02-18 DIAGNOSIS — E559 Vitamin D deficiency, unspecified: Secondary | ICD-10-CM | POA: Diagnosis not present

## 2017-02-18 DIAGNOSIS — M1990 Unspecified osteoarthritis, unspecified site: Secondary | ICD-10-CM | POA: Diagnosis not present

## 2017-02-18 DIAGNOSIS — R52 Pain, unspecified: Secondary | ICD-10-CM | POA: Diagnosis not present

## 2017-02-18 DIAGNOSIS — K219 Gastro-esophageal reflux disease without esophagitis: Secondary | ICD-10-CM | POA: Diagnosis not present

## 2017-02-18 DIAGNOSIS — I10 Essential (primary) hypertension: Secondary | ICD-10-CM | POA: Diagnosis not present

## 2017-02-18 DIAGNOSIS — F0391 Unspecified dementia with behavioral disturbance: Secondary | ICD-10-CM | POA: Diagnosis not present

## 2017-02-22 DIAGNOSIS — J449 Chronic obstructive pulmonary disease, unspecified: Secondary | ICD-10-CM | POA: Diagnosis not present

## 2017-02-22 DIAGNOSIS — M6281 Muscle weakness (generalized): Secondary | ICD-10-CM | POA: Diagnosis not present

## 2017-02-22 DIAGNOSIS — I10 Essential (primary) hypertension: Secondary | ICD-10-CM | POA: Diagnosis not present

## 2017-02-22 DIAGNOSIS — M1990 Unspecified osteoarthritis, unspecified site: Secondary | ICD-10-CM | POA: Diagnosis not present

## 2017-02-22 DIAGNOSIS — L89322 Pressure ulcer of left buttock, stage 2: Secondary | ICD-10-CM | POA: Diagnosis not present

## 2017-02-22 DIAGNOSIS — F0391 Unspecified dementia with behavioral disturbance: Secondary | ICD-10-CM | POA: Diagnosis not present

## 2017-02-25 DIAGNOSIS — R0989 Other specified symptoms and signs involving the circulatory and respiratory systems: Secondary | ICD-10-CM | POA: Diagnosis not present

## 2017-02-25 DIAGNOSIS — R05 Cough: Secondary | ICD-10-CM | POA: Diagnosis not present

## 2017-02-26 ENCOUNTER — Emergency Department (HOSPITAL_COMMUNITY): Payer: Medicare Other

## 2017-02-26 ENCOUNTER — Encounter (HOSPITAL_COMMUNITY): Payer: Self-pay | Admitting: Emergency Medicine

## 2017-02-26 ENCOUNTER — Emergency Department (HOSPITAL_COMMUNITY)
Admission: EM | Admit: 2017-02-26 | Discharge: 2017-02-26 | Disposition: A | Discharge status: Home / Self Care | Payer: Medicare Other | Attending: Emergency Medicine | Admitting: Emergency Medicine

## 2017-02-26 ENCOUNTER — Other Ambulatory Visit: Payer: Self-pay

## 2017-02-26 DIAGNOSIS — J45909 Unspecified asthma, uncomplicated: Secondary | ICD-10-CM | POA: Insufficient documentation

## 2017-02-26 DIAGNOSIS — Z87891 Personal history of nicotine dependence: Secondary | ICD-10-CM | POA: Insufficient documentation

## 2017-02-26 DIAGNOSIS — F0390 Unspecified dementia without behavioral disturbance: Secondary | ICD-10-CM | POA: Diagnosis not present

## 2017-02-26 DIAGNOSIS — R531 Weakness: Secondary | ICD-10-CM

## 2017-02-26 DIAGNOSIS — Z79899 Other long term (current) drug therapy: Secondary | ICD-10-CM | POA: Diagnosis not present

## 2017-02-26 DIAGNOSIS — R404 Transient alteration of awareness: Secondary | ICD-10-CM | POA: Diagnosis not present

## 2017-02-26 DIAGNOSIS — J449 Chronic obstructive pulmonary disease, unspecified: Secondary | ICD-10-CM | POA: Insufficient documentation

## 2017-02-26 DIAGNOSIS — R0602 Shortness of breath: Secondary | ICD-10-CM

## 2017-02-26 DIAGNOSIS — Z743 Need for continuous supervision: Secondary | ICD-10-CM | POA: Diagnosis not present

## 2017-02-26 DIAGNOSIS — R279 Unspecified lack of coordination: Secondary | ICD-10-CM | POA: Diagnosis not present

## 2017-02-26 DIAGNOSIS — I10 Essential (primary) hypertension: Secondary | ICD-10-CM | POA: Diagnosis not present

## 2017-02-26 DIAGNOSIS — Z96653 Presence of artificial knee joint, bilateral: Secondary | ICD-10-CM | POA: Insufficient documentation

## 2017-02-26 LAB — COMPREHENSIVE METABOLIC PANEL
ALT: 12 U/L — AB (ref 17–63)
AST: 16 U/L (ref 15–41)
Albumin: 3.6 g/dL (ref 3.5–5.0)
Alkaline Phosphatase: 43 U/L (ref 38–126)
Anion gap: 7 (ref 5–15)
BILIRUBIN TOTAL: 0.7 mg/dL (ref 0.3–1.2)
BUN: 17 mg/dL (ref 6–20)
CALCIUM: 9.3 mg/dL (ref 8.9–10.3)
CO2: 32 mmol/L (ref 22–32)
CREATININE: 0.87 mg/dL (ref 0.61–1.24)
Chloride: 102 mmol/L (ref 101–111)
Glucose, Bld: 82 mg/dL (ref 65–99)
Potassium: 3.8 mmol/L (ref 3.5–5.1)
Sodium: 141 mmol/L (ref 135–145)
TOTAL PROTEIN: 6.8 g/dL (ref 6.5–8.1)

## 2017-02-26 LAB — CBC WITH DIFFERENTIAL/PLATELET
BASOS ABS: 0 10*3/uL (ref 0.0–0.1)
BASOS PCT: 0 %
EOS PCT: 8 %
Eosinophils Absolute: 0.4 10*3/uL (ref 0.0–0.7)
HCT: 45.7 % (ref 39.0–52.0)
Hemoglobin: 14.4 g/dL (ref 13.0–17.0)
Lymphocytes Relative: 37 %
Lymphs Abs: 2.1 10*3/uL (ref 0.7–4.0)
MCH: 28.4 pg (ref 26.0–34.0)
MCHC: 31.5 g/dL (ref 30.0–36.0)
MCV: 90.1 fL (ref 78.0–100.0)
Monocytes Absolute: 0.3 10*3/uL (ref 0.1–1.0)
Monocytes Relative: 6 %
Neutro Abs: 2.7 10*3/uL (ref 1.7–7.7)
Neutrophils Relative %: 49 %
PLATELETS: 227 10*3/uL (ref 150–400)
RBC: 5.07 MIL/uL (ref 4.22–5.81)
RDW: 14.5 % (ref 11.5–15.5)
WBC: 5.5 10*3/uL (ref 4.0–10.5)

## 2017-02-26 LAB — CBG MONITORING, ED: Glucose-Capillary: 69 mg/dL (ref 65–99)

## 2017-02-26 LAB — URINALYSIS, COMPLETE (UACMP) WITH MICROSCOPIC
Bacteria, UA: NONE SEEN
Bilirubin Urine: NEGATIVE
Glucose, UA: NEGATIVE mg/dL
Hgb urine dipstick: NEGATIVE
Ketones, ur: NEGATIVE mg/dL
LEUKOCYTES UA: NEGATIVE
NITRITE: NEGATIVE
PROTEIN: NEGATIVE mg/dL
Specific Gravity, Urine: 1.017 (ref 1.005–1.030)
Squamous Epithelial / LPF: NONE SEEN
pH: 6 (ref 5.0–8.0)

## 2017-02-26 MED ORDER — IPRATROPIUM-ALBUTEROL 0.5-2.5 (3) MG/3ML IN SOLN
3.0000 mL | Freq: Once | RESPIRATORY_TRACT | Status: AC
  Administered 2017-02-26: 3 mL via RESPIRATORY_TRACT
  Filled 2017-02-26: qty 3

## 2017-02-26 MED ORDER — SODIUM CHLORIDE 0.9 % IV SOLN
INTRAVENOUS | Status: DC
  Administered 2017-02-26: 13:00:00 via INTRAVENOUS

## 2017-02-26 MED ORDER — SODIUM CHLORIDE 0.9 % IV BOLUS (SEPSIS)
1000.0000 mL | Freq: Once | INTRAVENOUS | Status: AC
  Administered 2017-02-26: 1000 mL via INTRAVENOUS

## 2017-02-26 NOTE — Discharge Instructions (Signed)
As discussed, your evaluation today has been largely reassuring.  But, it is important that you monitor your condition carefully, and do not hesitate to return to the ED if you develop new, or concerning changes in your condition. ? ?Otherwise, please follow-up with your physician for appropriate ongoing care. ? ?
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
## 2017-02-26 NOTE — ED Triage Notes (Signed)
[facility] brought in [facility] d/t staff reporting generalized weakness x1 day. PT has hx of dementia and is a long-term resident of the ALF with a DNR form.

## 2017-02-26 NOTE — ED Notes (Signed)
Waiting on EMS to transport patient back to [facility].

## 2017-03-04 DIAGNOSIS — M79675 Pain in left toe(s): Secondary | ICD-10-CM | POA: Diagnosis not present

## 2017-03-04 DIAGNOSIS — L600 Ingrowing nail: Secondary | ICD-10-CM | POA: Diagnosis not present

## 2017-03-04 DIAGNOSIS — M79674 Pain in right toe(s): Secondary | ICD-10-CM | POA: Diagnosis not present

## 2017-03-10 DIAGNOSIS — F419 Anxiety disorder, unspecified: Secondary | ICD-10-CM | POA: Diagnosis not present

## 2017-03-10 DIAGNOSIS — F0151 Vascular dementia with behavioral disturbance: Secondary | ICD-10-CM | POA: Diagnosis not present

## 2017-03-10 DIAGNOSIS — F330 Major depressive disorder, recurrent, mild: Secondary | ICD-10-CM | POA: Diagnosis not present

## 2017-03-12 DIAGNOSIS — Z7401 Bed confinement status: Secondary | ICD-10-CM | POA: Diagnosis not present

## 2017-03-12 DIAGNOSIS — R279 Unspecified lack of coordination: Secondary | ICD-10-CM | POA: Diagnosis not present

## 2017-03-12 DIAGNOSIS — J449 Chronic obstructive pulmonary disease, unspecified: Secondary | ICD-10-CM | POA: Diagnosis not present

## 2017-03-12 DIAGNOSIS — F0150 Vascular dementia without behavioral disturbance: Secondary | ICD-10-CM | POA: Diagnosis not present

## 2017-03-12 DIAGNOSIS — F329 Major depressive disorder, single episode, unspecified: Secondary | ICD-10-CM | POA: Diagnosis not present

## 2017-03-12 DIAGNOSIS — R52 Pain, unspecified: Secondary | ICD-10-CM | POA: Diagnosis not present

## 2017-03-15 DIAGNOSIS — D649 Anemia, unspecified: Secondary | ICD-10-CM | POA: Diagnosis not present

## 2017-03-15 DIAGNOSIS — J449 Chronic obstructive pulmonary disease, unspecified: Secondary | ICD-10-CM | POA: Diagnosis not present

## 2017-03-15 DIAGNOSIS — F0150 Vascular dementia without behavioral disturbance: Secondary | ICD-10-CM | POA: Diagnosis not present

## 2017-03-17 DIAGNOSIS — R293 Abnormal posture: Secondary | ICD-10-CM | POA: Diagnosis not present

## 2017-03-17 DIAGNOSIS — E559 Vitamin D deficiency, unspecified: Secondary | ICD-10-CM | POA: Diagnosis not present

## 2017-03-17 DIAGNOSIS — J449 Chronic obstructive pulmonary disease, unspecified: Secondary | ICD-10-CM | POA: Diagnosis not present

## 2017-03-17 DIAGNOSIS — F329 Major depressive disorder, single episode, unspecified: Secondary | ICD-10-CM | POA: Diagnosis not present

## 2017-03-17 DIAGNOSIS — F0151 Vascular dementia with behavioral disturbance: Secondary | ICD-10-CM | POA: Diagnosis not present

## 2017-03-17 DIAGNOSIS — R278 Other lack of coordination: Secondary | ICD-10-CM | POA: Diagnosis not present

## 2017-03-17 DIAGNOSIS — M6281 Muscle weakness (generalized): Secondary | ICD-10-CM | POA: Diagnosis not present

## 2017-03-18 DIAGNOSIS — R52 Pain, unspecified: Secondary | ICD-10-CM | POA: Diagnosis not present

## 2017-03-18 DIAGNOSIS — R293 Abnormal posture: Secondary | ICD-10-CM | POA: Diagnosis not present

## 2017-03-18 DIAGNOSIS — M6281 Muscle weakness (generalized): Secondary | ICD-10-CM | POA: Diagnosis not present

## 2017-03-18 DIAGNOSIS — K219 Gastro-esophageal reflux disease without esophagitis: Secondary | ICD-10-CM | POA: Diagnosis not present

## 2017-03-18 DIAGNOSIS — F0391 Unspecified dementia with behavioral disturbance: Secondary | ICD-10-CM | POA: Diagnosis not present

## 2017-03-18 DIAGNOSIS — R278 Other lack of coordination: Secondary | ICD-10-CM | POA: Diagnosis not present

## 2017-03-18 DIAGNOSIS — E876 Hypokalemia: Secondary | ICD-10-CM | POA: Diagnosis not present

## 2017-03-18 DIAGNOSIS — J449 Chronic obstructive pulmonary disease, unspecified: Secondary | ICD-10-CM | POA: Diagnosis not present

## 2017-03-18 DIAGNOSIS — E559 Vitamin D deficiency, unspecified: Secondary | ICD-10-CM | POA: Diagnosis not present

## 2017-03-18 DIAGNOSIS — F330 Major depressive disorder, recurrent, mild: Secondary | ICD-10-CM | POA: Diagnosis not present

## 2017-03-19 DIAGNOSIS — R293 Abnormal posture: Secondary | ICD-10-CM | POA: Diagnosis not present

## 2017-03-19 DIAGNOSIS — J449 Chronic obstructive pulmonary disease, unspecified: Secondary | ICD-10-CM | POA: Diagnosis not present

## 2017-03-19 DIAGNOSIS — M6281 Muscle weakness (generalized): Secondary | ICD-10-CM | POA: Diagnosis not present

## 2017-03-19 DIAGNOSIS — F0150 Vascular dementia without behavioral disturbance: Secondary | ICD-10-CM | POA: Diagnosis not present

## 2017-03-19 DIAGNOSIS — R41841 Cognitive communication deficit: Secondary | ICD-10-CM | POA: Diagnosis not present

## 2017-03-19 DIAGNOSIS — R1312 Dysphagia, oropharyngeal phase: Secondary | ICD-10-CM | POA: Diagnosis not present

## 2017-03-19 DIAGNOSIS — R278 Other lack of coordination: Secondary | ICD-10-CM | POA: Diagnosis not present

## 2017-03-22 DIAGNOSIS — J449 Chronic obstructive pulmonary disease, unspecified: Secondary | ICD-10-CM | POA: Diagnosis not present

## 2017-03-22 DIAGNOSIS — R1312 Dysphagia, oropharyngeal phase: Secondary | ICD-10-CM | POA: Diagnosis not present

## 2017-03-22 DIAGNOSIS — M6281 Muscle weakness (generalized): Secondary | ICD-10-CM | POA: Diagnosis not present

## 2017-03-22 DIAGNOSIS — R41841 Cognitive communication deficit: Secondary | ICD-10-CM | POA: Diagnosis not present

## 2017-03-22 DIAGNOSIS — F0150 Vascular dementia without behavioral disturbance: Secondary | ICD-10-CM | POA: Diagnosis not present

## 2017-03-22 DIAGNOSIS — R278 Other lack of coordination: Secondary | ICD-10-CM | POA: Diagnosis not present

## 2017-03-23 DIAGNOSIS — M6281 Muscle weakness (generalized): Secondary | ICD-10-CM | POA: Diagnosis not present

## 2017-03-23 DIAGNOSIS — R41841 Cognitive communication deficit: Secondary | ICD-10-CM | POA: Diagnosis not present

## 2017-03-23 DIAGNOSIS — R278 Other lack of coordination: Secondary | ICD-10-CM | POA: Diagnosis not present

## 2017-03-23 DIAGNOSIS — J449 Chronic obstructive pulmonary disease, unspecified: Secondary | ICD-10-CM | POA: Diagnosis not present

## 2017-03-23 DIAGNOSIS — R1312 Dysphagia, oropharyngeal phase: Secondary | ICD-10-CM | POA: Diagnosis not present

## 2017-03-23 DIAGNOSIS — F0150 Vascular dementia without behavioral disturbance: Secondary | ICD-10-CM | POA: Diagnosis not present

## 2017-03-24 DIAGNOSIS — F0150 Vascular dementia without behavioral disturbance: Secondary | ICD-10-CM | POA: Diagnosis not present

## 2017-03-24 DIAGNOSIS — R1312 Dysphagia, oropharyngeal phase: Secondary | ICD-10-CM | POA: Diagnosis not present

## 2017-03-24 DIAGNOSIS — R41841 Cognitive communication deficit: Secondary | ICD-10-CM | POA: Diagnosis not present

## 2017-03-24 DIAGNOSIS — R278 Other lack of coordination: Secondary | ICD-10-CM | POA: Diagnosis not present

## 2017-03-24 DIAGNOSIS — J449 Chronic obstructive pulmonary disease, unspecified: Secondary | ICD-10-CM | POA: Diagnosis not present

## 2017-03-24 DIAGNOSIS — M6281 Muscle weakness (generalized): Secondary | ICD-10-CM | POA: Diagnosis not present

## 2017-03-25 DIAGNOSIS — F0150 Vascular dementia without behavioral disturbance: Secondary | ICD-10-CM | POA: Diagnosis not present

## 2017-03-25 DIAGNOSIS — R1312 Dysphagia, oropharyngeal phase: Secondary | ICD-10-CM | POA: Diagnosis not present

## 2017-03-25 DIAGNOSIS — M6281 Muscle weakness (generalized): Secondary | ICD-10-CM | POA: Diagnosis not present

## 2017-03-25 DIAGNOSIS — J449 Chronic obstructive pulmonary disease, unspecified: Secondary | ICD-10-CM | POA: Diagnosis not present

## 2017-03-25 DIAGNOSIS — R278 Other lack of coordination: Secondary | ICD-10-CM | POA: Diagnosis not present

## 2017-03-25 DIAGNOSIS — R41841 Cognitive communication deficit: Secondary | ICD-10-CM | POA: Diagnosis not present

## 2017-03-26 DIAGNOSIS — R41841 Cognitive communication deficit: Secondary | ICD-10-CM | POA: Diagnosis not present

## 2017-03-26 DIAGNOSIS — R1312 Dysphagia, oropharyngeal phase: Secondary | ICD-10-CM | POA: Diagnosis not present

## 2017-03-26 DIAGNOSIS — R278 Other lack of coordination: Secondary | ICD-10-CM | POA: Diagnosis not present

## 2017-03-26 DIAGNOSIS — J449 Chronic obstructive pulmonary disease, unspecified: Secondary | ICD-10-CM | POA: Diagnosis not present

## 2017-03-26 DIAGNOSIS — F0150 Vascular dementia without behavioral disturbance: Secondary | ICD-10-CM | POA: Diagnosis not present

## 2017-03-26 DIAGNOSIS — M6281 Muscle weakness (generalized): Secondary | ICD-10-CM | POA: Diagnosis not present

## 2017-03-29 DIAGNOSIS — R278 Other lack of coordination: Secondary | ICD-10-CM | POA: Diagnosis not present

## 2017-03-29 DIAGNOSIS — J449 Chronic obstructive pulmonary disease, unspecified: Secondary | ICD-10-CM | POA: Diagnosis not present

## 2017-03-29 DIAGNOSIS — F0150 Vascular dementia without behavioral disturbance: Secondary | ICD-10-CM | POA: Diagnosis not present

## 2017-03-29 DIAGNOSIS — M6281 Muscle weakness (generalized): Secondary | ICD-10-CM | POA: Diagnosis not present

## 2017-03-29 DIAGNOSIS — R41841 Cognitive communication deficit: Secondary | ICD-10-CM | POA: Diagnosis not present

## 2017-03-29 DIAGNOSIS — R1312 Dysphagia, oropharyngeal phase: Secondary | ICD-10-CM | POA: Diagnosis not present

## 2017-03-30 DIAGNOSIS — R1312 Dysphagia, oropharyngeal phase: Secondary | ICD-10-CM | POA: Diagnosis not present

## 2017-03-30 DIAGNOSIS — M6281 Muscle weakness (generalized): Secondary | ICD-10-CM | POA: Diagnosis not present

## 2017-03-30 DIAGNOSIS — F0150 Vascular dementia without behavioral disturbance: Secondary | ICD-10-CM | POA: Diagnosis not present

## 2017-03-30 DIAGNOSIS — J449 Chronic obstructive pulmonary disease, unspecified: Secondary | ICD-10-CM | POA: Diagnosis not present

## 2017-03-30 DIAGNOSIS — R278 Other lack of coordination: Secondary | ICD-10-CM | POA: Diagnosis not present

## 2017-03-30 DIAGNOSIS — R41841 Cognitive communication deficit: Secondary | ICD-10-CM | POA: Diagnosis not present

## 2017-04-01 DIAGNOSIS — R278 Other lack of coordination: Secondary | ICD-10-CM | POA: Diagnosis not present

## 2017-04-01 DIAGNOSIS — J449 Chronic obstructive pulmonary disease, unspecified: Secondary | ICD-10-CM | POA: Diagnosis not present

## 2017-04-01 DIAGNOSIS — M6281 Muscle weakness (generalized): Secondary | ICD-10-CM | POA: Diagnosis not present

## 2017-04-01 DIAGNOSIS — R41841 Cognitive communication deficit: Secondary | ICD-10-CM | POA: Diagnosis not present

## 2017-04-01 DIAGNOSIS — R1312 Dysphagia, oropharyngeal phase: Secondary | ICD-10-CM | POA: Diagnosis not present

## 2017-04-01 DIAGNOSIS — F0150 Vascular dementia without behavioral disturbance: Secondary | ICD-10-CM | POA: Diagnosis not present

## 2017-04-02 DIAGNOSIS — R1312 Dysphagia, oropharyngeal phase: Secondary | ICD-10-CM | POA: Diagnosis not present

## 2017-04-02 DIAGNOSIS — J449 Chronic obstructive pulmonary disease, unspecified: Secondary | ICD-10-CM | POA: Diagnosis not present

## 2017-04-02 DIAGNOSIS — F0150 Vascular dementia without behavioral disturbance: Secondary | ICD-10-CM | POA: Diagnosis not present

## 2017-04-02 DIAGNOSIS — M6281 Muscle weakness (generalized): Secondary | ICD-10-CM | POA: Diagnosis not present

## 2017-04-02 DIAGNOSIS — R41841 Cognitive communication deficit: Secondary | ICD-10-CM | POA: Diagnosis not present

## 2017-04-02 DIAGNOSIS — R278 Other lack of coordination: Secondary | ICD-10-CM | POA: Diagnosis not present

## 2017-04-06 DIAGNOSIS — R41841 Cognitive communication deficit: Secondary | ICD-10-CM | POA: Diagnosis not present

## 2017-04-06 DIAGNOSIS — J449 Chronic obstructive pulmonary disease, unspecified: Secondary | ICD-10-CM | POA: Diagnosis not present

## 2017-04-06 DIAGNOSIS — R278 Other lack of coordination: Secondary | ICD-10-CM | POA: Diagnosis not present

## 2017-04-06 DIAGNOSIS — M6281 Muscle weakness (generalized): Secondary | ICD-10-CM | POA: Diagnosis not present

## 2017-04-06 DIAGNOSIS — R1312 Dysphagia, oropharyngeal phase: Secondary | ICD-10-CM | POA: Diagnosis not present

## 2017-04-06 DIAGNOSIS — F0150 Vascular dementia without behavioral disturbance: Secondary | ICD-10-CM | POA: Diagnosis not present

## 2017-04-07 DIAGNOSIS — R41841 Cognitive communication deficit: Secondary | ICD-10-CM | POA: Diagnosis not present

## 2017-04-07 DIAGNOSIS — F0150 Vascular dementia without behavioral disturbance: Secondary | ICD-10-CM | POA: Diagnosis not present

## 2017-04-07 DIAGNOSIS — R278 Other lack of coordination: Secondary | ICD-10-CM | POA: Diagnosis not present

## 2017-04-07 DIAGNOSIS — J449 Chronic obstructive pulmonary disease, unspecified: Secondary | ICD-10-CM | POA: Diagnosis not present

## 2017-04-07 DIAGNOSIS — R1312 Dysphagia, oropharyngeal phase: Secondary | ICD-10-CM | POA: Diagnosis not present

## 2017-04-07 DIAGNOSIS — M6281 Muscle weakness (generalized): Secondary | ICD-10-CM | POA: Diagnosis not present

## 2017-04-08 DIAGNOSIS — F0150 Vascular dementia without behavioral disturbance: Secondary | ICD-10-CM | POA: Diagnosis not present

## 2017-04-08 DIAGNOSIS — R41841 Cognitive communication deficit: Secondary | ICD-10-CM | POA: Diagnosis not present

## 2017-04-08 DIAGNOSIS — R1312 Dysphagia, oropharyngeal phase: Secondary | ICD-10-CM | POA: Diagnosis not present

## 2017-04-08 DIAGNOSIS — R278 Other lack of coordination: Secondary | ICD-10-CM | POA: Diagnosis not present

## 2017-04-08 DIAGNOSIS — M6281 Muscle weakness (generalized): Secondary | ICD-10-CM | POA: Diagnosis not present

## 2017-04-08 DIAGNOSIS — J449 Chronic obstructive pulmonary disease, unspecified: Secondary | ICD-10-CM | POA: Diagnosis not present

## 2017-04-09 DIAGNOSIS — J449 Chronic obstructive pulmonary disease, unspecified: Secondary | ICD-10-CM | POA: Diagnosis not present

## 2017-04-09 DIAGNOSIS — R278 Other lack of coordination: Secondary | ICD-10-CM | POA: Diagnosis not present

## 2017-04-09 DIAGNOSIS — R41841 Cognitive communication deficit: Secondary | ICD-10-CM | POA: Diagnosis not present

## 2017-04-09 DIAGNOSIS — M6281 Muscle weakness (generalized): Secondary | ICD-10-CM | POA: Diagnosis not present

## 2017-04-09 DIAGNOSIS — R1312 Dysphagia, oropharyngeal phase: Secondary | ICD-10-CM | POA: Diagnosis not present

## 2017-04-09 DIAGNOSIS — F0150 Vascular dementia without behavioral disturbance: Secondary | ICD-10-CM | POA: Diagnosis not present

## 2017-04-12 DIAGNOSIS — F0150 Vascular dementia without behavioral disturbance: Secondary | ICD-10-CM | POA: Diagnosis not present

## 2017-04-12 DIAGNOSIS — M6281 Muscle weakness (generalized): Secondary | ICD-10-CM | POA: Diagnosis not present

## 2017-04-12 DIAGNOSIS — R278 Other lack of coordination: Secondary | ICD-10-CM | POA: Diagnosis not present

## 2017-04-12 DIAGNOSIS — R41841 Cognitive communication deficit: Secondary | ICD-10-CM | POA: Diagnosis not present

## 2017-04-12 DIAGNOSIS — J449 Chronic obstructive pulmonary disease, unspecified: Secondary | ICD-10-CM | POA: Diagnosis not present

## 2017-04-12 DIAGNOSIS — R1312 Dysphagia, oropharyngeal phase: Secondary | ICD-10-CM | POA: Diagnosis not present

## 2017-04-13 DIAGNOSIS — R1312 Dysphagia, oropharyngeal phase: Secondary | ICD-10-CM | POA: Diagnosis not present

## 2017-04-13 DIAGNOSIS — F0150 Vascular dementia without behavioral disturbance: Secondary | ICD-10-CM | POA: Diagnosis not present

## 2017-04-13 DIAGNOSIS — J449 Chronic obstructive pulmonary disease, unspecified: Secondary | ICD-10-CM | POA: Diagnosis not present

## 2017-04-13 DIAGNOSIS — R41841 Cognitive communication deficit: Secondary | ICD-10-CM | POA: Diagnosis not present

## 2017-04-13 DIAGNOSIS — M6281 Muscle weakness (generalized): Secondary | ICD-10-CM | POA: Diagnosis not present

## 2017-04-13 DIAGNOSIS — R278 Other lack of coordination: Secondary | ICD-10-CM | POA: Diagnosis not present

## 2017-04-14 DIAGNOSIS — R278 Other lack of coordination: Secondary | ICD-10-CM | POA: Diagnosis not present

## 2017-04-14 DIAGNOSIS — F0150 Vascular dementia without behavioral disturbance: Secondary | ICD-10-CM | POA: Diagnosis not present

## 2017-04-14 DIAGNOSIS — J449 Chronic obstructive pulmonary disease, unspecified: Secondary | ICD-10-CM | POA: Diagnosis not present

## 2017-04-14 DIAGNOSIS — M6281 Muscle weakness (generalized): Secondary | ICD-10-CM | POA: Diagnosis not present

## 2017-04-14 DIAGNOSIS — R1312 Dysphagia, oropharyngeal phase: Secondary | ICD-10-CM | POA: Diagnosis not present

## 2017-04-14 DIAGNOSIS — R41841 Cognitive communication deficit: Secondary | ICD-10-CM | POA: Diagnosis not present

## 2017-04-15 DIAGNOSIS — F0150 Vascular dementia without behavioral disturbance: Secondary | ICD-10-CM | POA: Diagnosis not present

## 2017-04-15 DIAGNOSIS — R1312 Dysphagia, oropharyngeal phase: Secondary | ICD-10-CM | POA: Diagnosis not present

## 2017-04-15 DIAGNOSIS — M6281 Muscle weakness (generalized): Secondary | ICD-10-CM | POA: Diagnosis not present

## 2017-04-15 DIAGNOSIS — J449 Chronic obstructive pulmonary disease, unspecified: Secondary | ICD-10-CM | POA: Diagnosis not present

## 2017-04-15 DIAGNOSIS — R41841 Cognitive communication deficit: Secondary | ICD-10-CM | POA: Diagnosis not present

## 2017-04-15 DIAGNOSIS — R278 Other lack of coordination: Secondary | ICD-10-CM | POA: Diagnosis not present

## 2017-04-19 DIAGNOSIS — F0150 Vascular dementia without behavioral disturbance: Secondary | ICD-10-CM | POA: Diagnosis not present

## 2017-04-19 DIAGNOSIS — R1312 Dysphagia, oropharyngeal phase: Secondary | ICD-10-CM | POA: Diagnosis not present

## 2017-04-19 DIAGNOSIS — J449 Chronic obstructive pulmonary disease, unspecified: Secondary | ICD-10-CM | POA: Diagnosis not present

## 2017-04-19 DIAGNOSIS — R41841 Cognitive communication deficit: Secondary | ICD-10-CM | POA: Diagnosis not present

## 2017-04-20 DIAGNOSIS — R1312 Dysphagia, oropharyngeal phase: Secondary | ICD-10-CM | POA: Diagnosis not present

## 2017-04-20 DIAGNOSIS — R41841 Cognitive communication deficit: Secondary | ICD-10-CM | POA: Diagnosis not present

## 2017-04-20 DIAGNOSIS — F0150 Vascular dementia without behavioral disturbance: Secondary | ICD-10-CM | POA: Diagnosis not present

## 2017-04-20 DIAGNOSIS — J449 Chronic obstructive pulmonary disease, unspecified: Secondary | ICD-10-CM | POA: Diagnosis not present

## 2017-04-21 DIAGNOSIS — J449 Chronic obstructive pulmonary disease, unspecified: Secondary | ICD-10-CM | POA: Diagnosis not present

## 2017-04-21 DIAGNOSIS — R41841 Cognitive communication deficit: Secondary | ICD-10-CM | POA: Diagnosis not present

## 2017-04-21 DIAGNOSIS — F0150 Vascular dementia without behavioral disturbance: Secondary | ICD-10-CM | POA: Diagnosis not present

## 2017-04-21 DIAGNOSIS — R1312 Dysphagia, oropharyngeal phase: Secondary | ICD-10-CM | POA: Diagnosis not present

## 2017-04-22 DIAGNOSIS — F0150 Vascular dementia without behavioral disturbance: Secondary | ICD-10-CM | POA: Diagnosis not present

## 2017-04-22 DIAGNOSIS — J449 Chronic obstructive pulmonary disease, unspecified: Secondary | ICD-10-CM | POA: Diagnosis not present

## 2017-04-22 DIAGNOSIS — R41841 Cognitive communication deficit: Secondary | ICD-10-CM | POA: Diagnosis not present

## 2017-04-22 DIAGNOSIS — R1312 Dysphagia, oropharyngeal phase: Secondary | ICD-10-CM | POA: Diagnosis not present

## 2017-04-23 DIAGNOSIS — R41841 Cognitive communication deficit: Secondary | ICD-10-CM | POA: Diagnosis not present

## 2017-04-23 DIAGNOSIS — R1312 Dysphagia, oropharyngeal phase: Secondary | ICD-10-CM | POA: Diagnosis not present

## 2017-04-23 DIAGNOSIS — F0150 Vascular dementia without behavioral disturbance: Secondary | ICD-10-CM | POA: Diagnosis not present

## 2017-04-23 DIAGNOSIS — J449 Chronic obstructive pulmonary disease, unspecified: Secondary | ICD-10-CM | POA: Diagnosis not present

## 2017-04-26 DIAGNOSIS — J449 Chronic obstructive pulmonary disease, unspecified: Secondary | ICD-10-CM | POA: Diagnosis not present

## 2017-04-26 DIAGNOSIS — R1312 Dysphagia, oropharyngeal phase: Secondary | ICD-10-CM | POA: Diagnosis not present

## 2017-04-26 DIAGNOSIS — R41841 Cognitive communication deficit: Secondary | ICD-10-CM | POA: Diagnosis not present

## 2017-04-26 DIAGNOSIS — F0150 Vascular dementia without behavioral disturbance: Secondary | ICD-10-CM | POA: Diagnosis not present

## 2017-04-27 DIAGNOSIS — J449 Chronic obstructive pulmonary disease, unspecified: Secondary | ICD-10-CM | POA: Diagnosis not present

## 2017-04-27 DIAGNOSIS — R41841 Cognitive communication deficit: Secondary | ICD-10-CM | POA: Diagnosis not present

## 2017-04-27 DIAGNOSIS — R1312 Dysphagia, oropharyngeal phase: Secondary | ICD-10-CM | POA: Diagnosis not present

## 2017-04-27 DIAGNOSIS — F0150 Vascular dementia without behavioral disturbance: Secondary | ICD-10-CM | POA: Diagnosis not present

## 2017-04-28 DIAGNOSIS — R1312 Dysphagia, oropharyngeal phase: Secondary | ICD-10-CM | POA: Diagnosis not present

## 2017-04-28 DIAGNOSIS — R41841 Cognitive communication deficit: Secondary | ICD-10-CM | POA: Diagnosis not present

## 2017-04-28 DIAGNOSIS — J449 Chronic obstructive pulmonary disease, unspecified: Secondary | ICD-10-CM | POA: Diagnosis not present

## 2017-04-28 DIAGNOSIS — F0150 Vascular dementia without behavioral disturbance: Secondary | ICD-10-CM | POA: Diagnosis not present

## 2017-04-29 DIAGNOSIS — F0150 Vascular dementia without behavioral disturbance: Secondary | ICD-10-CM | POA: Diagnosis not present

## 2017-04-29 DIAGNOSIS — R1312 Dysphagia, oropharyngeal phase: Secondary | ICD-10-CM | POA: Diagnosis not present

## 2017-04-29 DIAGNOSIS — R41841 Cognitive communication deficit: Secondary | ICD-10-CM | POA: Diagnosis not present

## 2017-04-29 DIAGNOSIS — J449 Chronic obstructive pulmonary disease, unspecified: Secondary | ICD-10-CM | POA: Diagnosis not present

## 2017-04-30 DIAGNOSIS — R1312 Dysphagia, oropharyngeal phase: Secondary | ICD-10-CM | POA: Diagnosis not present

## 2017-04-30 DIAGNOSIS — F0150 Vascular dementia without behavioral disturbance: Secondary | ICD-10-CM | POA: Diagnosis not present

## 2017-04-30 DIAGNOSIS — J449 Chronic obstructive pulmonary disease, unspecified: Secondary | ICD-10-CM | POA: Diagnosis not present

## 2017-04-30 DIAGNOSIS — R41841 Cognitive communication deficit: Secondary | ICD-10-CM | POA: Diagnosis not present

## 2017-05-03 DIAGNOSIS — R41841 Cognitive communication deficit: Secondary | ICD-10-CM | POA: Diagnosis not present

## 2017-05-03 DIAGNOSIS — F0150 Vascular dementia without behavioral disturbance: Secondary | ICD-10-CM | POA: Diagnosis not present

## 2017-05-03 DIAGNOSIS — R1312 Dysphagia, oropharyngeal phase: Secondary | ICD-10-CM | POA: Diagnosis not present

## 2017-05-03 DIAGNOSIS — E559 Vitamin D deficiency, unspecified: Secondary | ICD-10-CM | POA: Diagnosis not present

## 2017-05-03 DIAGNOSIS — J449 Chronic obstructive pulmonary disease, unspecified: Secondary | ICD-10-CM | POA: Diagnosis not present

## 2017-05-03 DIAGNOSIS — K219 Gastro-esophageal reflux disease without esophagitis: Secondary | ICD-10-CM | POA: Diagnosis not present

## 2017-05-04 DIAGNOSIS — R41841 Cognitive communication deficit: Secondary | ICD-10-CM | POA: Diagnosis not present

## 2017-05-04 DIAGNOSIS — J449 Chronic obstructive pulmonary disease, unspecified: Secondary | ICD-10-CM | POA: Diagnosis not present

## 2017-05-04 DIAGNOSIS — R1312 Dysphagia, oropharyngeal phase: Secondary | ICD-10-CM | POA: Diagnosis not present

## 2017-05-04 DIAGNOSIS — F0150 Vascular dementia without behavioral disturbance: Secondary | ICD-10-CM | POA: Diagnosis not present

## 2017-05-05 DIAGNOSIS — F0150 Vascular dementia without behavioral disturbance: Secondary | ICD-10-CM | POA: Diagnosis not present

## 2017-05-05 DIAGNOSIS — J449 Chronic obstructive pulmonary disease, unspecified: Secondary | ICD-10-CM | POA: Diagnosis not present

## 2017-05-05 DIAGNOSIS — R41841 Cognitive communication deficit: Secondary | ICD-10-CM | POA: Diagnosis not present

## 2017-05-05 DIAGNOSIS — R1312 Dysphagia, oropharyngeal phase: Secondary | ICD-10-CM | POA: Diagnosis not present

## 2017-05-06 DIAGNOSIS — F0150 Vascular dementia without behavioral disturbance: Secondary | ICD-10-CM | POA: Diagnosis not present

## 2017-05-06 DIAGNOSIS — J449 Chronic obstructive pulmonary disease, unspecified: Secondary | ICD-10-CM | POA: Diagnosis not present

## 2017-05-06 DIAGNOSIS — R1312 Dysphagia, oropharyngeal phase: Secondary | ICD-10-CM | POA: Diagnosis not present

## 2017-05-06 DIAGNOSIS — R41841 Cognitive communication deficit: Secondary | ICD-10-CM | POA: Diagnosis not present

## 2017-05-07 DIAGNOSIS — J449 Chronic obstructive pulmonary disease, unspecified: Secondary | ICD-10-CM | POA: Diagnosis not present

## 2017-05-07 DIAGNOSIS — R1312 Dysphagia, oropharyngeal phase: Secondary | ICD-10-CM | POA: Diagnosis not present

## 2017-05-07 DIAGNOSIS — R41841 Cognitive communication deficit: Secondary | ICD-10-CM | POA: Diagnosis not present

## 2017-05-07 DIAGNOSIS — F0150 Vascular dementia without behavioral disturbance: Secondary | ICD-10-CM | POA: Diagnosis not present

## 2017-05-10 DIAGNOSIS — R1312 Dysphagia, oropharyngeal phase: Secondary | ICD-10-CM | POA: Diagnosis not present

## 2017-05-10 DIAGNOSIS — J449 Chronic obstructive pulmonary disease, unspecified: Secondary | ICD-10-CM | POA: Diagnosis not present

## 2017-05-10 DIAGNOSIS — F0150 Vascular dementia without behavioral disturbance: Secondary | ICD-10-CM | POA: Diagnosis not present

## 2017-05-10 DIAGNOSIS — R41841 Cognitive communication deficit: Secondary | ICD-10-CM | POA: Diagnosis not present

## 2017-05-11 DIAGNOSIS — J449 Chronic obstructive pulmonary disease, unspecified: Secondary | ICD-10-CM | POA: Diagnosis not present

## 2017-05-11 DIAGNOSIS — R41841 Cognitive communication deficit: Secondary | ICD-10-CM | POA: Diagnosis not present

## 2017-05-11 DIAGNOSIS — R1312 Dysphagia, oropharyngeal phase: Secondary | ICD-10-CM | POA: Diagnosis not present

## 2017-05-11 DIAGNOSIS — F0150 Vascular dementia without behavioral disturbance: Secondary | ICD-10-CM | POA: Diagnosis not present

## 2017-05-12 DIAGNOSIS — J449 Chronic obstructive pulmonary disease, unspecified: Secondary | ICD-10-CM | POA: Diagnosis not present

## 2017-05-12 DIAGNOSIS — F0150 Vascular dementia without behavioral disturbance: Secondary | ICD-10-CM | POA: Diagnosis not present

## 2017-05-12 DIAGNOSIS — R41841 Cognitive communication deficit: Secondary | ICD-10-CM | POA: Diagnosis not present

## 2017-05-12 DIAGNOSIS — R1312 Dysphagia, oropharyngeal phase: Secondary | ICD-10-CM | POA: Diagnosis not present

## 2017-05-13 DIAGNOSIS — F0150 Vascular dementia without behavioral disturbance: Secondary | ICD-10-CM | POA: Diagnosis not present

## 2017-05-13 DIAGNOSIS — R1312 Dysphagia, oropharyngeal phase: Secondary | ICD-10-CM | POA: Diagnosis not present

## 2017-05-13 DIAGNOSIS — R41841 Cognitive communication deficit: Secondary | ICD-10-CM | POA: Diagnosis not present

## 2017-05-13 DIAGNOSIS — J449 Chronic obstructive pulmonary disease, unspecified: Secondary | ICD-10-CM | POA: Diagnosis not present

## 2017-05-14 DIAGNOSIS — J449 Chronic obstructive pulmonary disease, unspecified: Secondary | ICD-10-CM | POA: Diagnosis not present

## 2017-05-14 DIAGNOSIS — R41841 Cognitive communication deficit: Secondary | ICD-10-CM | POA: Diagnosis not present

## 2017-05-14 DIAGNOSIS — F0150 Vascular dementia without behavioral disturbance: Secondary | ICD-10-CM | POA: Diagnosis not present

## 2017-05-14 DIAGNOSIS — R1312 Dysphagia, oropharyngeal phase: Secondary | ICD-10-CM | POA: Diagnosis not present

## 2017-05-17 DIAGNOSIS — R41841 Cognitive communication deficit: Secondary | ICD-10-CM | POA: Diagnosis not present

## 2017-05-17 DIAGNOSIS — R1312 Dysphagia, oropharyngeal phase: Secondary | ICD-10-CM | POA: Diagnosis not present

## 2017-05-17 DIAGNOSIS — J449 Chronic obstructive pulmonary disease, unspecified: Secondary | ICD-10-CM | POA: Diagnosis not present

## 2017-05-17 DIAGNOSIS — F0150 Vascular dementia without behavioral disturbance: Secondary | ICD-10-CM | POA: Diagnosis not present

## 2017-05-18 DIAGNOSIS — J449 Chronic obstructive pulmonary disease, unspecified: Secondary | ICD-10-CM | POA: Diagnosis not present

## 2017-05-18 DIAGNOSIS — R1312 Dysphagia, oropharyngeal phase: Secondary | ICD-10-CM | POA: Diagnosis not present

## 2017-05-18 DIAGNOSIS — F0150 Vascular dementia without behavioral disturbance: Secondary | ICD-10-CM | POA: Diagnosis not present

## 2017-05-18 DIAGNOSIS — R41841 Cognitive communication deficit: Secondary | ICD-10-CM | POA: Diagnosis not present

## 2017-05-19 DIAGNOSIS — R1312 Dysphagia, oropharyngeal phase: Secondary | ICD-10-CM | POA: Diagnosis not present

## 2017-05-19 DIAGNOSIS — R41841 Cognitive communication deficit: Secondary | ICD-10-CM | POA: Diagnosis not present

## 2017-05-19 DIAGNOSIS — J449 Chronic obstructive pulmonary disease, unspecified: Secondary | ICD-10-CM | POA: Diagnosis not present

## 2017-05-19 DIAGNOSIS — F0150 Vascular dementia without behavioral disturbance: Secondary | ICD-10-CM | POA: Diagnosis not present

## 2017-05-20 DIAGNOSIS — J449 Chronic obstructive pulmonary disease, unspecified: Secondary | ICD-10-CM | POA: Diagnosis not present

## 2017-05-20 DIAGNOSIS — R41841 Cognitive communication deficit: Secondary | ICD-10-CM | POA: Diagnosis not present

## 2017-05-20 DIAGNOSIS — F0150 Vascular dementia without behavioral disturbance: Secondary | ICD-10-CM | POA: Diagnosis not present

## 2017-05-20 DIAGNOSIS — R1312 Dysphagia, oropharyngeal phase: Secondary | ICD-10-CM | POA: Diagnosis not present

## 2017-05-21 DIAGNOSIS — J449 Chronic obstructive pulmonary disease, unspecified: Secondary | ICD-10-CM | POA: Diagnosis not present

## 2017-05-21 DIAGNOSIS — R41841 Cognitive communication deficit: Secondary | ICD-10-CM | POA: Diagnosis not present

## 2017-05-21 DIAGNOSIS — F0150 Vascular dementia without behavioral disturbance: Secondary | ICD-10-CM | POA: Diagnosis not present

## 2017-05-21 DIAGNOSIS — R1312 Dysphagia, oropharyngeal phase: Secondary | ICD-10-CM | POA: Diagnosis not present

## 2017-05-24 DIAGNOSIS — J449 Chronic obstructive pulmonary disease, unspecified: Secondary | ICD-10-CM | POA: Diagnosis not present

## 2017-05-24 DIAGNOSIS — R41841 Cognitive communication deficit: Secondary | ICD-10-CM | POA: Diagnosis not present

## 2017-05-24 DIAGNOSIS — R1312 Dysphagia, oropharyngeal phase: Secondary | ICD-10-CM | POA: Diagnosis not present

## 2017-05-24 DIAGNOSIS — F0150 Vascular dementia without behavioral disturbance: Secondary | ICD-10-CM | POA: Diagnosis not present

## 2017-05-25 DIAGNOSIS — J449 Chronic obstructive pulmonary disease, unspecified: Secondary | ICD-10-CM | POA: Diagnosis not present

## 2017-05-25 DIAGNOSIS — F0150 Vascular dementia without behavioral disturbance: Secondary | ICD-10-CM | POA: Diagnosis not present

## 2017-05-25 DIAGNOSIS — R41841 Cognitive communication deficit: Secondary | ICD-10-CM | POA: Diagnosis not present

## 2017-05-25 DIAGNOSIS — R1312 Dysphagia, oropharyngeal phase: Secondary | ICD-10-CM | POA: Diagnosis not present

## 2017-05-26 DIAGNOSIS — J449 Chronic obstructive pulmonary disease, unspecified: Secondary | ICD-10-CM | POA: Diagnosis not present

## 2017-05-26 DIAGNOSIS — F0150 Vascular dementia without behavioral disturbance: Secondary | ICD-10-CM | POA: Diagnosis not present

## 2017-05-26 DIAGNOSIS — R41841 Cognitive communication deficit: Secondary | ICD-10-CM | POA: Diagnosis not present

## 2017-05-26 DIAGNOSIS — R1312 Dysphagia, oropharyngeal phase: Secondary | ICD-10-CM | POA: Diagnosis not present

## 2017-05-27 DIAGNOSIS — F0150 Vascular dementia without behavioral disturbance: Secondary | ICD-10-CM | POA: Diagnosis not present

## 2017-05-27 DIAGNOSIS — J449 Chronic obstructive pulmonary disease, unspecified: Secondary | ICD-10-CM | POA: Diagnosis not present

## 2017-05-27 DIAGNOSIS — R1312 Dysphagia, oropharyngeal phase: Secondary | ICD-10-CM | POA: Diagnosis not present

## 2017-05-27 DIAGNOSIS — R41841 Cognitive communication deficit: Secondary | ICD-10-CM | POA: Diagnosis not present

## 2017-05-28 DIAGNOSIS — R41841 Cognitive communication deficit: Secondary | ICD-10-CM | POA: Diagnosis not present

## 2017-05-28 DIAGNOSIS — F0150 Vascular dementia without behavioral disturbance: Secondary | ICD-10-CM | POA: Diagnosis not present

## 2017-05-28 DIAGNOSIS — J449 Chronic obstructive pulmonary disease, unspecified: Secondary | ICD-10-CM | POA: Diagnosis not present

## 2017-05-28 DIAGNOSIS — R1312 Dysphagia, oropharyngeal phase: Secondary | ICD-10-CM | POA: Diagnosis not present

## 2017-05-28 DIAGNOSIS — R062 Wheezing: Secondary | ICD-10-CM | POA: Diagnosis not present

## 2017-05-31 DIAGNOSIS — F0150 Vascular dementia without behavioral disturbance: Secondary | ICD-10-CM | POA: Diagnosis not present

## 2017-05-31 DIAGNOSIS — R1312 Dysphagia, oropharyngeal phase: Secondary | ICD-10-CM | POA: Diagnosis not present

## 2017-05-31 DIAGNOSIS — J449 Chronic obstructive pulmonary disease, unspecified: Secondary | ICD-10-CM | POA: Diagnosis not present

## 2017-05-31 DIAGNOSIS — R41841 Cognitive communication deficit: Secondary | ICD-10-CM | POA: Diagnosis not present

## 2017-06-01 DIAGNOSIS — J449 Chronic obstructive pulmonary disease, unspecified: Secondary | ICD-10-CM | POA: Diagnosis not present

## 2017-06-01 DIAGNOSIS — F0150 Vascular dementia without behavioral disturbance: Secondary | ICD-10-CM | POA: Diagnosis not present

## 2017-06-01 DIAGNOSIS — R1312 Dysphagia, oropharyngeal phase: Secondary | ICD-10-CM | POA: Diagnosis not present

## 2017-06-01 DIAGNOSIS — R41841 Cognitive communication deficit: Secondary | ICD-10-CM | POA: Diagnosis not present

## 2017-06-03 DIAGNOSIS — R1312 Dysphagia, oropharyngeal phase: Secondary | ICD-10-CM | POA: Diagnosis not present

## 2017-06-03 DIAGNOSIS — R41841 Cognitive communication deficit: Secondary | ICD-10-CM | POA: Diagnosis not present

## 2017-06-03 DIAGNOSIS — J449 Chronic obstructive pulmonary disease, unspecified: Secondary | ICD-10-CM | POA: Diagnosis not present

## 2017-06-03 DIAGNOSIS — F0150 Vascular dementia without behavioral disturbance: Secondary | ICD-10-CM | POA: Diagnosis not present

## 2017-06-04 DIAGNOSIS — F0150 Vascular dementia without behavioral disturbance: Secondary | ICD-10-CM | POA: Diagnosis not present

## 2017-06-04 DIAGNOSIS — J449 Chronic obstructive pulmonary disease, unspecified: Secondary | ICD-10-CM | POA: Diagnosis not present

## 2017-06-04 DIAGNOSIS — R41841 Cognitive communication deficit: Secondary | ICD-10-CM | POA: Diagnosis not present

## 2017-06-04 DIAGNOSIS — R1312 Dysphagia, oropharyngeal phase: Secondary | ICD-10-CM | POA: Diagnosis not present

## 2017-06-07 DIAGNOSIS — R41841 Cognitive communication deficit: Secondary | ICD-10-CM | POA: Diagnosis not present

## 2017-06-07 DIAGNOSIS — F0150 Vascular dementia without behavioral disturbance: Secondary | ICD-10-CM | POA: Diagnosis not present

## 2017-06-07 DIAGNOSIS — J449 Chronic obstructive pulmonary disease, unspecified: Secondary | ICD-10-CM | POA: Diagnosis not present

## 2017-06-07 DIAGNOSIS — R1312 Dysphagia, oropharyngeal phase: Secondary | ICD-10-CM | POA: Diagnosis not present

## 2017-06-09 DIAGNOSIS — R41841 Cognitive communication deficit: Secondary | ICD-10-CM | POA: Diagnosis not present

## 2017-06-09 DIAGNOSIS — F0150 Vascular dementia without behavioral disturbance: Secondary | ICD-10-CM | POA: Diagnosis not present

## 2017-06-09 DIAGNOSIS — J449 Chronic obstructive pulmonary disease, unspecified: Secondary | ICD-10-CM | POA: Diagnosis not present

## 2017-06-09 DIAGNOSIS — R1312 Dysphagia, oropharyngeal phase: Secondary | ICD-10-CM | POA: Diagnosis not present

## 2017-06-11 DIAGNOSIS — F419 Anxiety disorder, unspecified: Secondary | ICD-10-CM | POA: Diagnosis not present

## 2017-06-11 DIAGNOSIS — F329 Major depressive disorder, single episode, unspecified: Secondary | ICD-10-CM | POA: Diagnosis not present

## 2017-06-11 DIAGNOSIS — R41841 Cognitive communication deficit: Secondary | ICD-10-CM | POA: Diagnosis not present

## 2017-06-11 DIAGNOSIS — J449 Chronic obstructive pulmonary disease, unspecified: Secondary | ICD-10-CM | POA: Diagnosis not present

## 2017-06-11 DIAGNOSIS — R1312 Dysphagia, oropharyngeal phase: Secondary | ICD-10-CM | POA: Diagnosis not present

## 2017-06-11 DIAGNOSIS — F0150 Vascular dementia without behavioral disturbance: Secondary | ICD-10-CM | POA: Diagnosis not present

## 2017-06-14 DIAGNOSIS — R1312 Dysphagia, oropharyngeal phase: Secondary | ICD-10-CM | POA: Diagnosis not present

## 2017-06-14 DIAGNOSIS — F0150 Vascular dementia without behavioral disturbance: Secondary | ICD-10-CM | POA: Diagnosis not present

## 2017-06-14 DIAGNOSIS — R41841 Cognitive communication deficit: Secondary | ICD-10-CM | POA: Diagnosis not present

## 2017-06-14 DIAGNOSIS — J449 Chronic obstructive pulmonary disease, unspecified: Secondary | ICD-10-CM | POA: Diagnosis not present

## 2017-07-30 DIAGNOSIS — F0150 Vascular dementia without behavioral disturbance: Secondary | ICD-10-CM | POA: Diagnosis not present

## 2017-07-30 DIAGNOSIS — M6281 Muscle weakness (generalized): Secondary | ICD-10-CM | POA: Diagnosis not present

## 2017-07-30 DIAGNOSIS — R52 Pain, unspecified: Secondary | ICD-10-CM | POA: Diagnosis not present

## 2017-07-30 DIAGNOSIS — M25552 Pain in left hip: Secondary | ICD-10-CM | POA: Diagnosis not present

## 2017-07-30 DIAGNOSIS — J449 Chronic obstructive pulmonary disease, unspecified: Secondary | ICD-10-CM | POA: Diagnosis not present

## 2017-08-02 DIAGNOSIS — M6281 Muscle weakness (generalized): Secondary | ICD-10-CM | POA: Diagnosis not present

## 2017-08-02 DIAGNOSIS — J449 Chronic obstructive pulmonary disease, unspecified: Secondary | ICD-10-CM | POA: Diagnosis not present

## 2017-08-02 DIAGNOSIS — R52 Pain, unspecified: Secondary | ICD-10-CM | POA: Diagnosis not present

## 2017-08-03 DIAGNOSIS — J449 Chronic obstructive pulmonary disease, unspecified: Secondary | ICD-10-CM | POA: Diagnosis not present

## 2017-08-03 DIAGNOSIS — R52 Pain, unspecified: Secondary | ICD-10-CM | POA: Diagnosis not present

## 2017-08-03 DIAGNOSIS — M6281 Muscle weakness (generalized): Secondary | ICD-10-CM | POA: Diagnosis not present

## 2017-08-04 DIAGNOSIS — R52 Pain, unspecified: Secondary | ICD-10-CM | POA: Diagnosis not present

## 2017-08-04 DIAGNOSIS — J449 Chronic obstructive pulmonary disease, unspecified: Secondary | ICD-10-CM | POA: Diagnosis not present

## 2017-08-04 DIAGNOSIS — M6281 Muscle weakness (generalized): Secondary | ICD-10-CM | POA: Diagnosis not present

## 2017-08-05 DIAGNOSIS — M6281 Muscle weakness (generalized): Secondary | ICD-10-CM | POA: Diagnosis not present

## 2017-08-05 DIAGNOSIS — R52 Pain, unspecified: Secondary | ICD-10-CM | POA: Diagnosis not present

## 2017-08-05 DIAGNOSIS — J449 Chronic obstructive pulmonary disease, unspecified: Secondary | ICD-10-CM | POA: Diagnosis not present

## 2017-08-06 DIAGNOSIS — J449 Chronic obstructive pulmonary disease, unspecified: Secondary | ICD-10-CM | POA: Diagnosis not present

## 2017-08-06 DIAGNOSIS — M6281 Muscle weakness (generalized): Secondary | ICD-10-CM | POA: Diagnosis not present

## 2017-08-06 DIAGNOSIS — R52 Pain, unspecified: Secondary | ICD-10-CM | POA: Diagnosis not present

## 2017-08-09 DIAGNOSIS — M6281 Muscle weakness (generalized): Secondary | ICD-10-CM | POA: Diagnosis not present

## 2017-08-09 DIAGNOSIS — J449 Chronic obstructive pulmonary disease, unspecified: Secondary | ICD-10-CM | POA: Diagnosis not present

## 2017-08-09 DIAGNOSIS — R52 Pain, unspecified: Secondary | ICD-10-CM | POA: Diagnosis not present

## 2017-08-10 DIAGNOSIS — R52 Pain, unspecified: Secondary | ICD-10-CM | POA: Diagnosis not present

## 2017-08-10 DIAGNOSIS — J449 Chronic obstructive pulmonary disease, unspecified: Secondary | ICD-10-CM | POA: Diagnosis not present

## 2017-08-10 DIAGNOSIS — M6281 Muscle weakness (generalized): Secondary | ICD-10-CM | POA: Diagnosis not present

## 2017-08-11 DIAGNOSIS — R52 Pain, unspecified: Secondary | ICD-10-CM | POA: Diagnosis not present

## 2017-08-11 DIAGNOSIS — J449 Chronic obstructive pulmonary disease, unspecified: Secondary | ICD-10-CM | POA: Diagnosis not present

## 2017-08-11 DIAGNOSIS — M6281 Muscle weakness (generalized): Secondary | ICD-10-CM | POA: Diagnosis not present

## 2017-08-12 DIAGNOSIS — J449 Chronic obstructive pulmonary disease, unspecified: Secondary | ICD-10-CM | POA: Diagnosis not present

## 2017-08-12 DIAGNOSIS — M6281 Muscle weakness (generalized): Secondary | ICD-10-CM | POA: Diagnosis not present

## 2017-08-12 DIAGNOSIS — R52 Pain, unspecified: Secondary | ICD-10-CM | POA: Diagnosis not present

## 2017-08-13 DIAGNOSIS — J449 Chronic obstructive pulmonary disease, unspecified: Secondary | ICD-10-CM | POA: Diagnosis not present

## 2017-08-13 DIAGNOSIS — M6281 Muscle weakness (generalized): Secondary | ICD-10-CM | POA: Diagnosis not present

## 2017-08-13 DIAGNOSIS — R52 Pain, unspecified: Secondary | ICD-10-CM | POA: Diagnosis not present

## 2017-08-16 DIAGNOSIS — R52 Pain, unspecified: Secondary | ICD-10-CM | POA: Diagnosis not present

## 2017-08-16 DIAGNOSIS — M6281 Muscle weakness (generalized): Secondary | ICD-10-CM | POA: Diagnosis not present

## 2017-08-16 DIAGNOSIS — J449 Chronic obstructive pulmonary disease, unspecified: Secondary | ICD-10-CM | POA: Diagnosis not present

## 2017-08-17 DIAGNOSIS — J449 Chronic obstructive pulmonary disease, unspecified: Secondary | ICD-10-CM | POA: Diagnosis not present

## 2017-08-17 DIAGNOSIS — R52 Pain, unspecified: Secondary | ICD-10-CM | POA: Diagnosis not present

## 2017-08-17 DIAGNOSIS — M6281 Muscle weakness (generalized): Secondary | ICD-10-CM | POA: Diagnosis not present

## 2017-08-18 DIAGNOSIS — M6281 Muscle weakness (generalized): Secondary | ICD-10-CM | POA: Diagnosis not present

## 2017-08-18 DIAGNOSIS — R52 Pain, unspecified: Secondary | ICD-10-CM | POA: Diagnosis not present

## 2017-08-18 DIAGNOSIS — J449 Chronic obstructive pulmonary disease, unspecified: Secondary | ICD-10-CM | POA: Diagnosis not present

## 2017-08-19 DIAGNOSIS — J449 Chronic obstructive pulmonary disease, unspecified: Secondary | ICD-10-CM | POA: Diagnosis not present

## 2017-08-19 DIAGNOSIS — R52 Pain, unspecified: Secondary | ICD-10-CM | POA: Diagnosis not present

## 2017-08-19 DIAGNOSIS — M6281 Muscle weakness (generalized): Secondary | ICD-10-CM | POA: Diagnosis not present

## 2017-08-20 DIAGNOSIS — R52 Pain, unspecified: Secondary | ICD-10-CM | POA: Diagnosis not present

## 2017-08-20 DIAGNOSIS — M6281 Muscle weakness (generalized): Secondary | ICD-10-CM | POA: Diagnosis not present

## 2017-08-20 DIAGNOSIS — J449 Chronic obstructive pulmonary disease, unspecified: Secondary | ICD-10-CM | POA: Diagnosis not present

## 2017-08-23 DIAGNOSIS — J449 Chronic obstructive pulmonary disease, unspecified: Secondary | ICD-10-CM | POA: Diagnosis not present

## 2017-08-23 DIAGNOSIS — R52 Pain, unspecified: Secondary | ICD-10-CM | POA: Diagnosis not present

## 2017-08-23 DIAGNOSIS — M6281 Muscle weakness (generalized): Secondary | ICD-10-CM | POA: Diagnosis not present

## 2017-08-24 DIAGNOSIS — J449 Chronic obstructive pulmonary disease, unspecified: Secondary | ICD-10-CM | POA: Diagnosis not present

## 2017-08-24 DIAGNOSIS — M6281 Muscle weakness (generalized): Secondary | ICD-10-CM | POA: Diagnosis not present

## 2017-08-24 DIAGNOSIS — R52 Pain, unspecified: Secondary | ICD-10-CM | POA: Diagnosis not present

## 2017-08-25 DIAGNOSIS — R52 Pain, unspecified: Secondary | ICD-10-CM | POA: Diagnosis not present

## 2017-08-25 DIAGNOSIS — M6281 Muscle weakness (generalized): Secondary | ICD-10-CM | POA: Diagnosis not present

## 2017-08-25 DIAGNOSIS — J449 Chronic obstructive pulmonary disease, unspecified: Secondary | ICD-10-CM | POA: Diagnosis not present

## 2017-08-26 DIAGNOSIS — M6281 Muscle weakness (generalized): Secondary | ICD-10-CM | POA: Diagnosis not present

## 2017-08-26 DIAGNOSIS — R52 Pain, unspecified: Secondary | ICD-10-CM | POA: Diagnosis not present

## 2017-08-26 DIAGNOSIS — J449 Chronic obstructive pulmonary disease, unspecified: Secondary | ICD-10-CM | POA: Diagnosis not present

## 2017-08-28 DIAGNOSIS — M6281 Muscle weakness (generalized): Secondary | ICD-10-CM | POA: Diagnosis not present

## 2017-08-28 DIAGNOSIS — J449 Chronic obstructive pulmonary disease, unspecified: Secondary | ICD-10-CM | POA: Diagnosis not present

## 2017-08-28 DIAGNOSIS — R52 Pain, unspecified: Secondary | ICD-10-CM | POA: Diagnosis not present

## 2017-08-30 DIAGNOSIS — R52 Pain, unspecified: Secondary | ICD-10-CM | POA: Diagnosis not present

## 2017-08-30 DIAGNOSIS — M6281 Muscle weakness (generalized): Secondary | ICD-10-CM | POA: Diagnosis not present

## 2017-08-30 DIAGNOSIS — J449 Chronic obstructive pulmonary disease, unspecified: Secondary | ICD-10-CM | POA: Diagnosis not present

## 2017-08-31 DIAGNOSIS — R52 Pain, unspecified: Secondary | ICD-10-CM | POA: Diagnosis not present

## 2017-08-31 DIAGNOSIS — M6281 Muscle weakness (generalized): Secondary | ICD-10-CM | POA: Diagnosis not present

## 2017-08-31 DIAGNOSIS — J449 Chronic obstructive pulmonary disease, unspecified: Secondary | ICD-10-CM | POA: Diagnosis not present

## 2017-09-01 DIAGNOSIS — M6281 Muscle weakness (generalized): Secondary | ICD-10-CM | POA: Diagnosis not present

## 2017-09-01 DIAGNOSIS — J449 Chronic obstructive pulmonary disease, unspecified: Secondary | ICD-10-CM | POA: Diagnosis not present

## 2017-09-01 DIAGNOSIS — R52 Pain, unspecified: Secondary | ICD-10-CM | POA: Diagnosis not present

## 2017-09-02 DIAGNOSIS — J449 Chronic obstructive pulmonary disease, unspecified: Secondary | ICD-10-CM | POA: Diagnosis not present

## 2017-09-02 DIAGNOSIS — R52 Pain, unspecified: Secondary | ICD-10-CM | POA: Diagnosis not present

## 2017-09-02 DIAGNOSIS — M6281 Muscle weakness (generalized): Secondary | ICD-10-CM | POA: Diagnosis not present

## 2017-09-03 DIAGNOSIS — R52 Pain, unspecified: Secondary | ICD-10-CM | POA: Diagnosis not present

## 2017-09-03 DIAGNOSIS — J449 Chronic obstructive pulmonary disease, unspecified: Secondary | ICD-10-CM | POA: Diagnosis not present

## 2017-09-03 DIAGNOSIS — M6281 Muscle weakness (generalized): Secondary | ICD-10-CM | POA: Diagnosis not present

## 2017-09-06 DIAGNOSIS — J449 Chronic obstructive pulmonary disease, unspecified: Secondary | ICD-10-CM | POA: Diagnosis not present

## 2017-09-06 DIAGNOSIS — R52 Pain, unspecified: Secondary | ICD-10-CM | POA: Diagnosis not present

## 2017-09-06 DIAGNOSIS — M6281 Muscle weakness (generalized): Secondary | ICD-10-CM | POA: Diagnosis not present

## 2017-09-07 DIAGNOSIS — R52 Pain, unspecified: Secondary | ICD-10-CM | POA: Diagnosis not present

## 2017-09-07 DIAGNOSIS — J449 Chronic obstructive pulmonary disease, unspecified: Secondary | ICD-10-CM | POA: Diagnosis not present

## 2017-09-07 DIAGNOSIS — M6281 Muscle weakness (generalized): Secondary | ICD-10-CM | POA: Diagnosis not present

## 2017-09-08 DIAGNOSIS — M6281 Muscle weakness (generalized): Secondary | ICD-10-CM | POA: Diagnosis not present

## 2017-09-08 DIAGNOSIS — J449 Chronic obstructive pulmonary disease, unspecified: Secondary | ICD-10-CM | POA: Diagnosis not present

## 2017-09-08 DIAGNOSIS — R52 Pain, unspecified: Secondary | ICD-10-CM | POA: Diagnosis not present

## 2017-09-22 IMAGING — CT CT HEAD W/O CM
1 series · 15 of 30 positions shown, 19 images · non-contrast
Comparison: None

ADDENDUM:
Comparison is made to MRI brain 06/02/2014. The degree of
ventricular enlargement is not significantly changed. Of note, the
third ventricle appears more normal. The temporal tips are not
dilated. On the basis of this comparison, hydrocephalus is
considered unlikely.
CLINICAL DATA: Depression, dementia, has not been taking his
medications, confusion, agitation, history asthma, COPD, former
smoker

EXAM:
CT HEAD WITHOUT CONTRAST
TECHNIQUE: Contiguous axial images were obtained from the base of the skull
through the vertex without intravenous contrast.

[Series 3: headseq 4.8 h37s · axial · 0.45mm/px · z∈[+196,+348]mm · 15 of 35 slices shown, 19 images]
[im 2/35  brain]
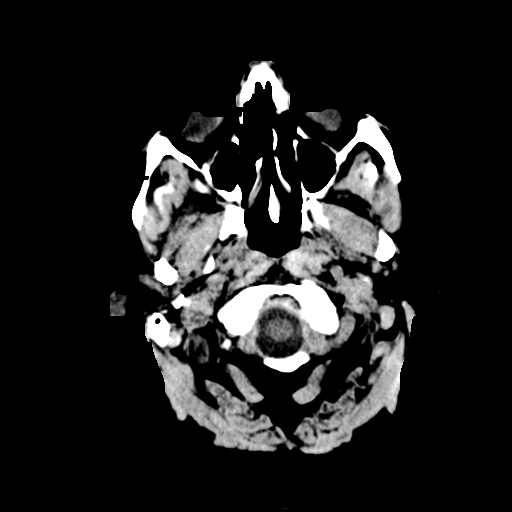
[im 2/35  bone]
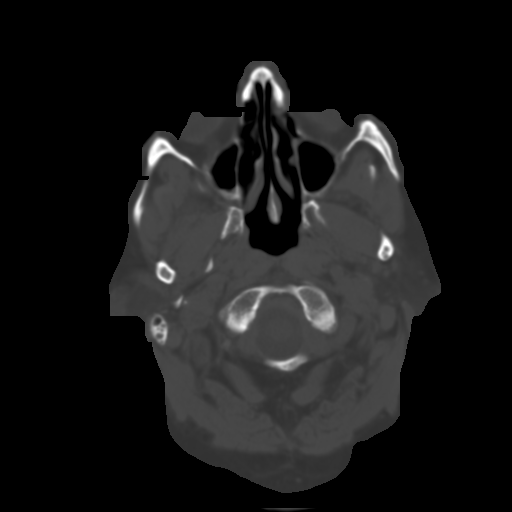
[im 4/35  brain]
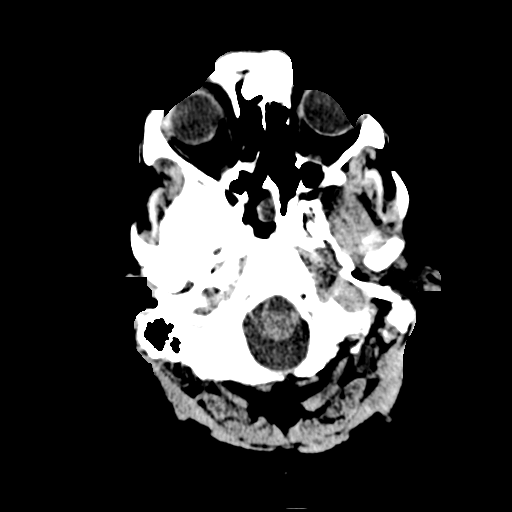
[im 6/35  brain]
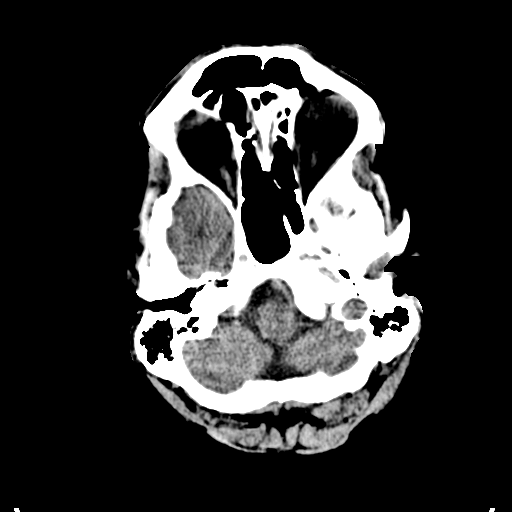
[im 9/35  brain]
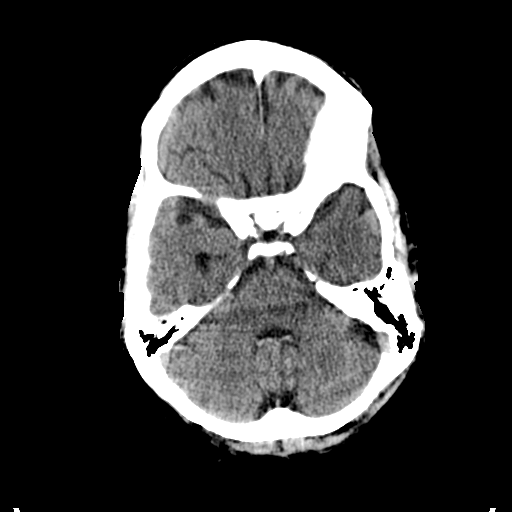
[im 11/35  brain]
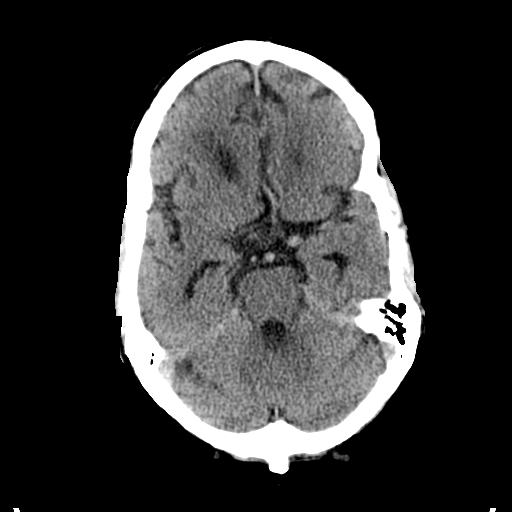
[im 11/35  bone]
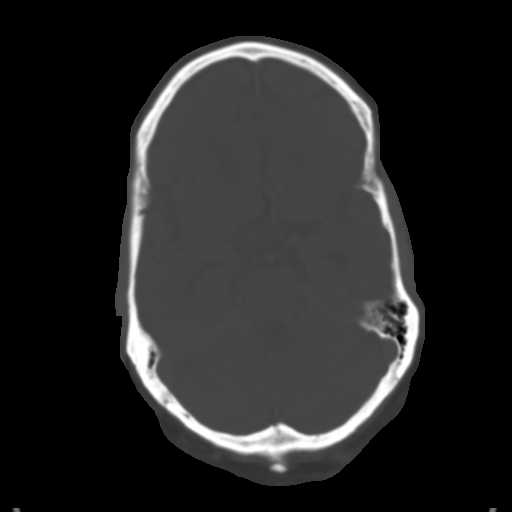
[im 13/35  brain]
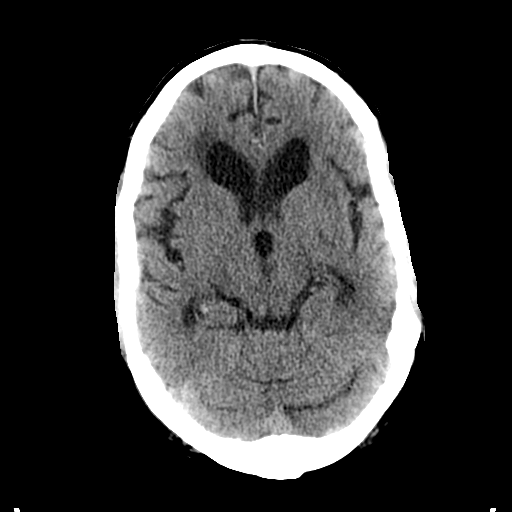
[im 16/35  brain]
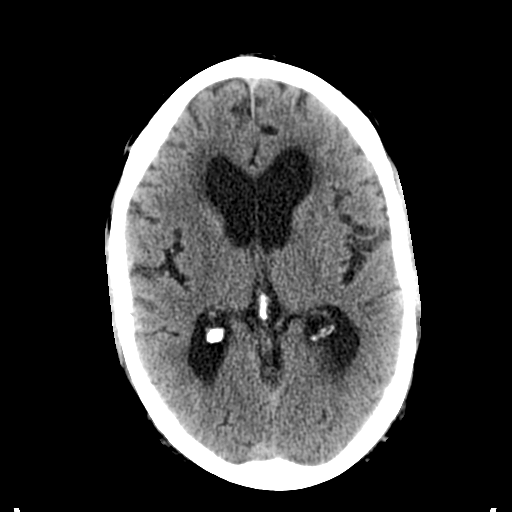
[im 18/35  brain]
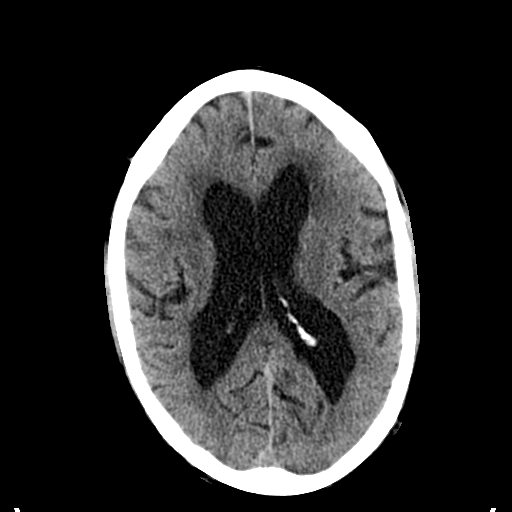
[im 19/35  brain]
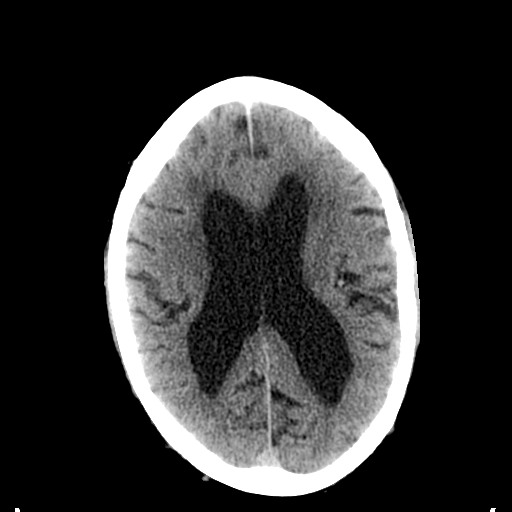
[im 19/35  bone]
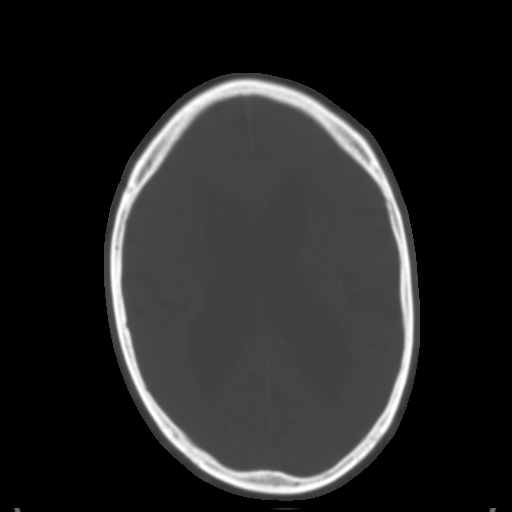
[im 22/35  brain]
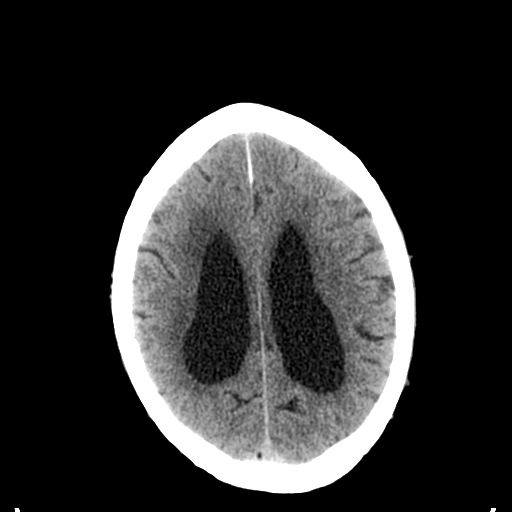
[im 24/35  brain]
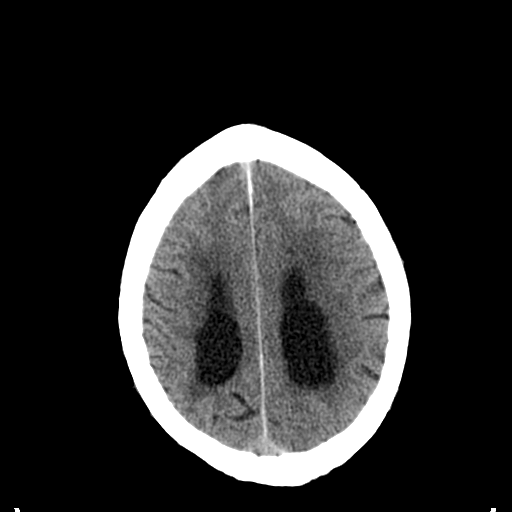
[im 26/35  brain]
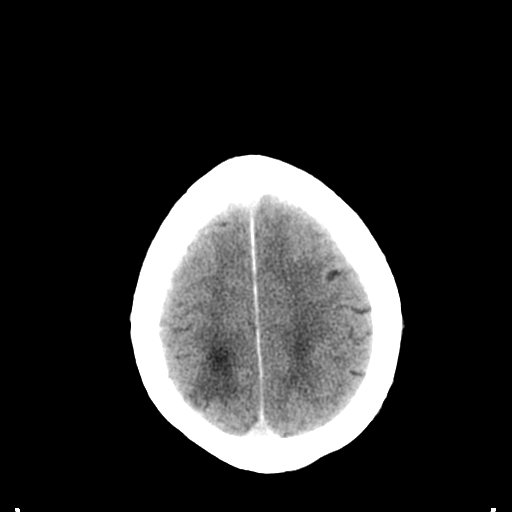
[im 29/35  brain]
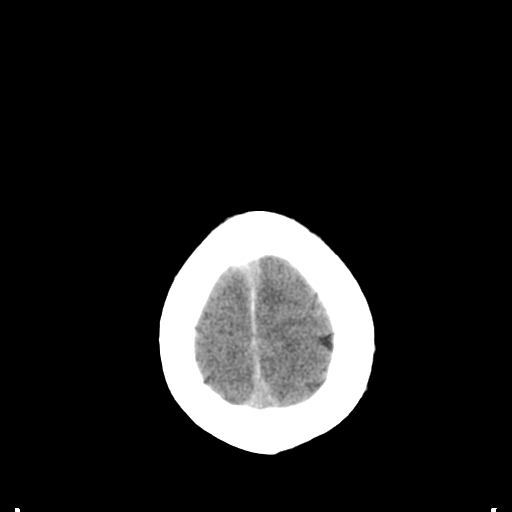
[im 29/35  bone]
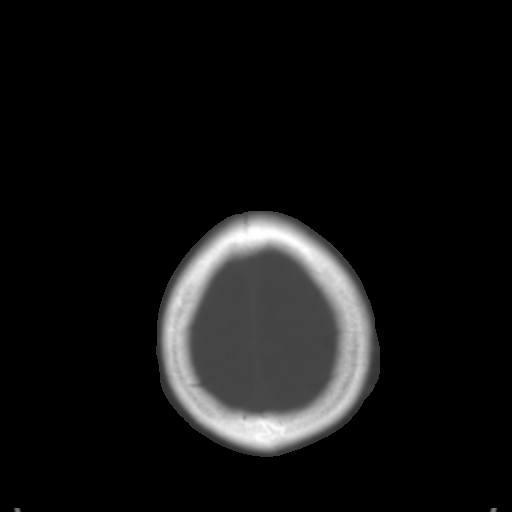
[im 31/35  brain]
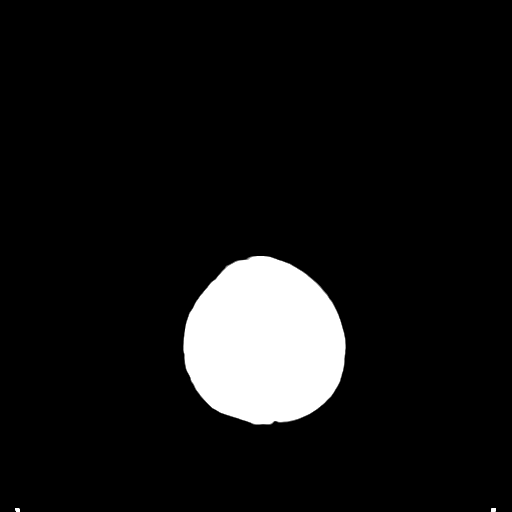
[im 33/35  brain]
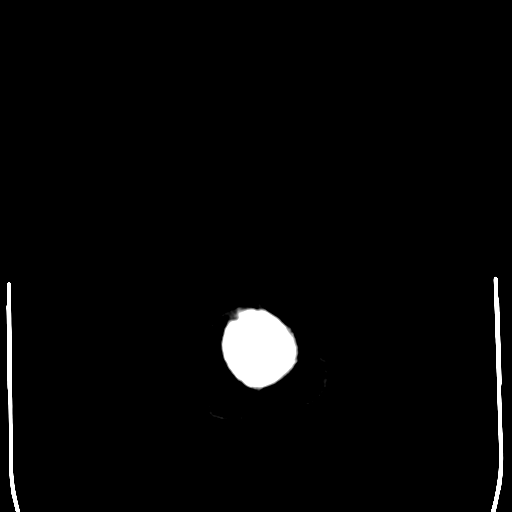

[15 of 30 positions shown; findings below may reference images not displayed]

FINDINGS: Mild dilatation of the ventricular system.

No midline shift or mass effect.

Minimal small vessel chronic ischemic changes of deep cerebral white
matter.

No intracranial hemorrhage, mass lesion, or evidence acute
infarction.

No extra-axial fluid collections.

Sinuses clear and bones unremarkable.
IMPRESSION: Minimal small vessel chronic ischemic changes of deep cerebral white
matter.

Mildly prominent ventricular system for a degree of sulcal atrophy,
raising question of normal pressure hydrocephalus.

No other intracranial abnormalities.

## 2017-10-14 DIAGNOSIS — F0150 Vascular dementia without behavioral disturbance: Secondary | ICD-10-CM | POA: Diagnosis not present

## 2017-10-14 DIAGNOSIS — E559 Vitamin D deficiency, unspecified: Secondary | ICD-10-CM | POA: Diagnosis not present

## 2017-10-14 DIAGNOSIS — M6281 Muscle weakness (generalized): Secondary | ICD-10-CM | POA: Diagnosis not present

## 2017-10-14 DIAGNOSIS — J449 Chronic obstructive pulmonary disease, unspecified: Secondary | ICD-10-CM | POA: Diagnosis not present

## 2017-10-23 DIAGNOSIS — E559 Vitamin D deficiency, unspecified: Secondary | ICD-10-CM | POA: Diagnosis not present

## 2017-10-23 DIAGNOSIS — D649 Anemia, unspecified: Secondary | ICD-10-CM | POA: Diagnosis not present

## 2017-11-02 DIAGNOSIS — J449 Chronic obstructive pulmonary disease, unspecified: Secondary | ICD-10-CM | POA: Diagnosis not present

## 2017-11-02 DIAGNOSIS — F0151 Vascular dementia with behavioral disturbance: Secondary | ICD-10-CM | POA: Diagnosis not present

## 2017-11-02 DIAGNOSIS — F419 Anxiety disorder, unspecified: Secondary | ICD-10-CM | POA: Diagnosis not present

## 2017-11-02 DIAGNOSIS — F329 Major depressive disorder, single episode, unspecified: Secondary | ICD-10-CM | POA: Diagnosis not present

## 2017-11-04 DIAGNOSIS — K219 Gastro-esophageal reflux disease without esophagitis: Secondary | ICD-10-CM | POA: Diagnosis not present

## 2017-11-04 DIAGNOSIS — F419 Anxiety disorder, unspecified: Secondary | ICD-10-CM | POA: Diagnosis not present

## 2017-11-04 DIAGNOSIS — J449 Chronic obstructive pulmonary disease, unspecified: Secondary | ICD-10-CM | POA: Diagnosis not present

## 2017-11-04 DIAGNOSIS — F0151 Vascular dementia with behavioral disturbance: Secondary | ICD-10-CM | POA: Diagnosis not present

## 2017-12-01 DIAGNOSIS — R21 Rash and other nonspecific skin eruption: Secondary | ICD-10-CM | POA: Diagnosis not present

## 2017-12-01 DIAGNOSIS — J449 Chronic obstructive pulmonary disease, unspecified: Secondary | ICD-10-CM | POA: Diagnosis not present

## 2017-12-01 DIAGNOSIS — F329 Major depressive disorder, single episode, unspecified: Secondary | ICD-10-CM | POA: Diagnosis not present

## 2017-12-01 DIAGNOSIS — R609 Edema, unspecified: Secondary | ICD-10-CM | POA: Diagnosis not present

## 2017-12-02 DIAGNOSIS — K219 Gastro-esophageal reflux disease without esophagitis: Secondary | ICD-10-CM | POA: Diagnosis not present

## 2017-12-02 DIAGNOSIS — F419 Anxiety disorder, unspecified: Secondary | ICD-10-CM | POA: Diagnosis not present

## 2017-12-02 DIAGNOSIS — F0151 Vascular dementia with behavioral disturbance: Secondary | ICD-10-CM | POA: Diagnosis not present

## 2017-12-02 DIAGNOSIS — J449 Chronic obstructive pulmonary disease, unspecified: Secondary | ICD-10-CM | POA: Diagnosis not present

## 2017-12-03 DIAGNOSIS — R0989 Other specified symptoms and signs involving the circulatory and respiratory systems: Secondary | ICD-10-CM | POA: Diagnosis not present

## 2017-12-28 DIAGNOSIS — M24561 Contracture, right knee: Secondary | ICD-10-CM | POA: Diagnosis not present

## 2017-12-28 DIAGNOSIS — J449 Chronic obstructive pulmonary disease, unspecified: Secondary | ICD-10-CM | POA: Diagnosis not present

## 2017-12-28 DIAGNOSIS — R1311 Dysphagia, oral phase: Secondary | ICD-10-CM | POA: Diagnosis not present

## 2017-12-29 DIAGNOSIS — J449 Chronic obstructive pulmonary disease, unspecified: Secondary | ICD-10-CM | POA: Diagnosis not present

## 2017-12-29 DIAGNOSIS — J811 Chronic pulmonary edema: Secondary | ICD-10-CM | POA: Diagnosis not present

## 2017-12-29 DIAGNOSIS — R1311 Dysphagia, oral phase: Secondary | ICD-10-CM | POA: Diagnosis not present

## 2017-12-29 DIAGNOSIS — M24561 Contracture, right knee: Secondary | ICD-10-CM | POA: Diagnosis not present

## 2017-12-30 DIAGNOSIS — J441 Chronic obstructive pulmonary disease with (acute) exacerbation: Secondary | ICD-10-CM | POA: Diagnosis not present

## 2017-12-30 DIAGNOSIS — K219 Gastro-esophageal reflux disease without esophagitis: Secondary | ICD-10-CM | POA: Diagnosis not present

## 2017-12-30 DIAGNOSIS — R1311 Dysphagia, oral phase: Secondary | ICD-10-CM | POA: Diagnosis not present

## 2017-12-30 DIAGNOSIS — F0150 Vascular dementia without behavioral disturbance: Secondary | ICD-10-CM | POA: Diagnosis not present

## 2017-12-30 DIAGNOSIS — J449 Chronic obstructive pulmonary disease, unspecified: Secondary | ICD-10-CM | POA: Diagnosis not present

## 2017-12-30 DIAGNOSIS — M24561 Contracture, right knee: Secondary | ICD-10-CM | POA: Diagnosis not present

## 2017-12-31 DIAGNOSIS — R1311 Dysphagia, oral phase: Secondary | ICD-10-CM | POA: Diagnosis not present

## 2017-12-31 DIAGNOSIS — M24561 Contracture, right knee: Secondary | ICD-10-CM | POA: Diagnosis not present

## 2017-12-31 DIAGNOSIS — J449 Chronic obstructive pulmonary disease, unspecified: Secondary | ICD-10-CM | POA: Diagnosis not present

## 2018-01-03 DIAGNOSIS — J449 Chronic obstructive pulmonary disease, unspecified: Secondary | ICD-10-CM | POA: Diagnosis not present

## 2018-01-03 DIAGNOSIS — M24561 Contracture, right knee: Secondary | ICD-10-CM | POA: Diagnosis not present

## 2018-01-03 DIAGNOSIS — R1311 Dysphagia, oral phase: Secondary | ICD-10-CM | POA: Diagnosis not present

## 2018-01-04 DIAGNOSIS — R1311 Dysphagia, oral phase: Secondary | ICD-10-CM | POA: Diagnosis not present

## 2018-01-04 DIAGNOSIS — M24561 Contracture, right knee: Secondary | ICD-10-CM | POA: Diagnosis not present

## 2018-01-04 DIAGNOSIS — J449 Chronic obstructive pulmonary disease, unspecified: Secondary | ICD-10-CM | POA: Diagnosis not present

## 2018-01-05 DIAGNOSIS — J449 Chronic obstructive pulmonary disease, unspecified: Secondary | ICD-10-CM | POA: Diagnosis not present

## 2018-01-05 DIAGNOSIS — M24561 Contracture, right knee: Secondary | ICD-10-CM | POA: Diagnosis not present

## 2018-01-05 DIAGNOSIS — R1311 Dysphagia, oral phase: Secondary | ICD-10-CM | POA: Diagnosis not present

## 2018-01-06 DIAGNOSIS — J449 Chronic obstructive pulmonary disease, unspecified: Secondary | ICD-10-CM | POA: Diagnosis not present

## 2018-01-06 DIAGNOSIS — R1311 Dysphagia, oral phase: Secondary | ICD-10-CM | POA: Diagnosis not present

## 2018-01-06 DIAGNOSIS — M24561 Contracture, right knee: Secondary | ICD-10-CM | POA: Diagnosis not present

## 2018-01-07 DIAGNOSIS — M24561 Contracture, right knee: Secondary | ICD-10-CM | POA: Diagnosis not present

## 2018-01-07 DIAGNOSIS — J449 Chronic obstructive pulmonary disease, unspecified: Secondary | ICD-10-CM | POA: Diagnosis not present

## 2018-01-07 DIAGNOSIS — R1311 Dysphagia, oral phase: Secondary | ICD-10-CM | POA: Diagnosis not present

## 2018-01-08 DIAGNOSIS — J449 Chronic obstructive pulmonary disease, unspecified: Secondary | ICD-10-CM | POA: Diagnosis not present

## 2018-01-08 DIAGNOSIS — M24561 Contracture, right knee: Secondary | ICD-10-CM | POA: Diagnosis not present

## 2018-01-08 DIAGNOSIS — R1311 Dysphagia, oral phase: Secondary | ICD-10-CM | POA: Diagnosis not present

## 2018-01-10 DIAGNOSIS — J449 Chronic obstructive pulmonary disease, unspecified: Secondary | ICD-10-CM | POA: Diagnosis not present

## 2018-01-10 DIAGNOSIS — R1311 Dysphagia, oral phase: Secondary | ICD-10-CM | POA: Diagnosis not present

## 2018-01-10 DIAGNOSIS — M24561 Contracture, right knee: Secondary | ICD-10-CM | POA: Diagnosis not present

## 2018-01-11 DIAGNOSIS — R1311 Dysphagia, oral phase: Secondary | ICD-10-CM | POA: Diagnosis not present

## 2018-01-11 DIAGNOSIS — M24561 Contracture, right knee: Secondary | ICD-10-CM | POA: Diagnosis not present

## 2018-01-11 DIAGNOSIS — J449 Chronic obstructive pulmonary disease, unspecified: Secondary | ICD-10-CM | POA: Diagnosis not present

## 2018-01-13 DIAGNOSIS — R1311 Dysphagia, oral phase: Secondary | ICD-10-CM | POA: Diagnosis not present

## 2018-01-13 DIAGNOSIS — M24561 Contracture, right knee: Secondary | ICD-10-CM | POA: Diagnosis not present

## 2018-01-13 DIAGNOSIS — J449 Chronic obstructive pulmonary disease, unspecified: Secondary | ICD-10-CM | POA: Diagnosis not present

## 2018-01-14 DIAGNOSIS — R1311 Dysphagia, oral phase: Secondary | ICD-10-CM | POA: Diagnosis not present

## 2018-01-14 DIAGNOSIS — F419 Anxiety disorder, unspecified: Secondary | ICD-10-CM | POA: Diagnosis not present

## 2018-01-14 DIAGNOSIS — M24561 Contracture, right knee: Secondary | ICD-10-CM | POA: Diagnosis not present

## 2018-01-14 DIAGNOSIS — F329 Major depressive disorder, single episode, unspecified: Secondary | ICD-10-CM | POA: Diagnosis not present

## 2018-01-14 DIAGNOSIS — J449 Chronic obstructive pulmonary disease, unspecified: Secondary | ICD-10-CM | POA: Diagnosis not present

## 2018-01-14 DIAGNOSIS — F0150 Vascular dementia without behavioral disturbance: Secondary | ICD-10-CM | POA: Diagnosis not present

## 2018-01-15 DIAGNOSIS — M24561 Contracture, right knee: Secondary | ICD-10-CM | POA: Diagnosis not present

## 2018-01-15 DIAGNOSIS — R1311 Dysphagia, oral phase: Secondary | ICD-10-CM | POA: Diagnosis not present

## 2018-01-15 DIAGNOSIS — J449 Chronic obstructive pulmonary disease, unspecified: Secondary | ICD-10-CM | POA: Diagnosis not present

## 2019-06-23 IMAGING — CR DG CHEST 1V PORT
1 series · 1 of 1 positions shown · non-contrast
Comparison: 07/17/2016

CLINICAL DATA: Shortness of breath

EXAM:
PORTABLE CHEST 1 VIEW

[ap portable]
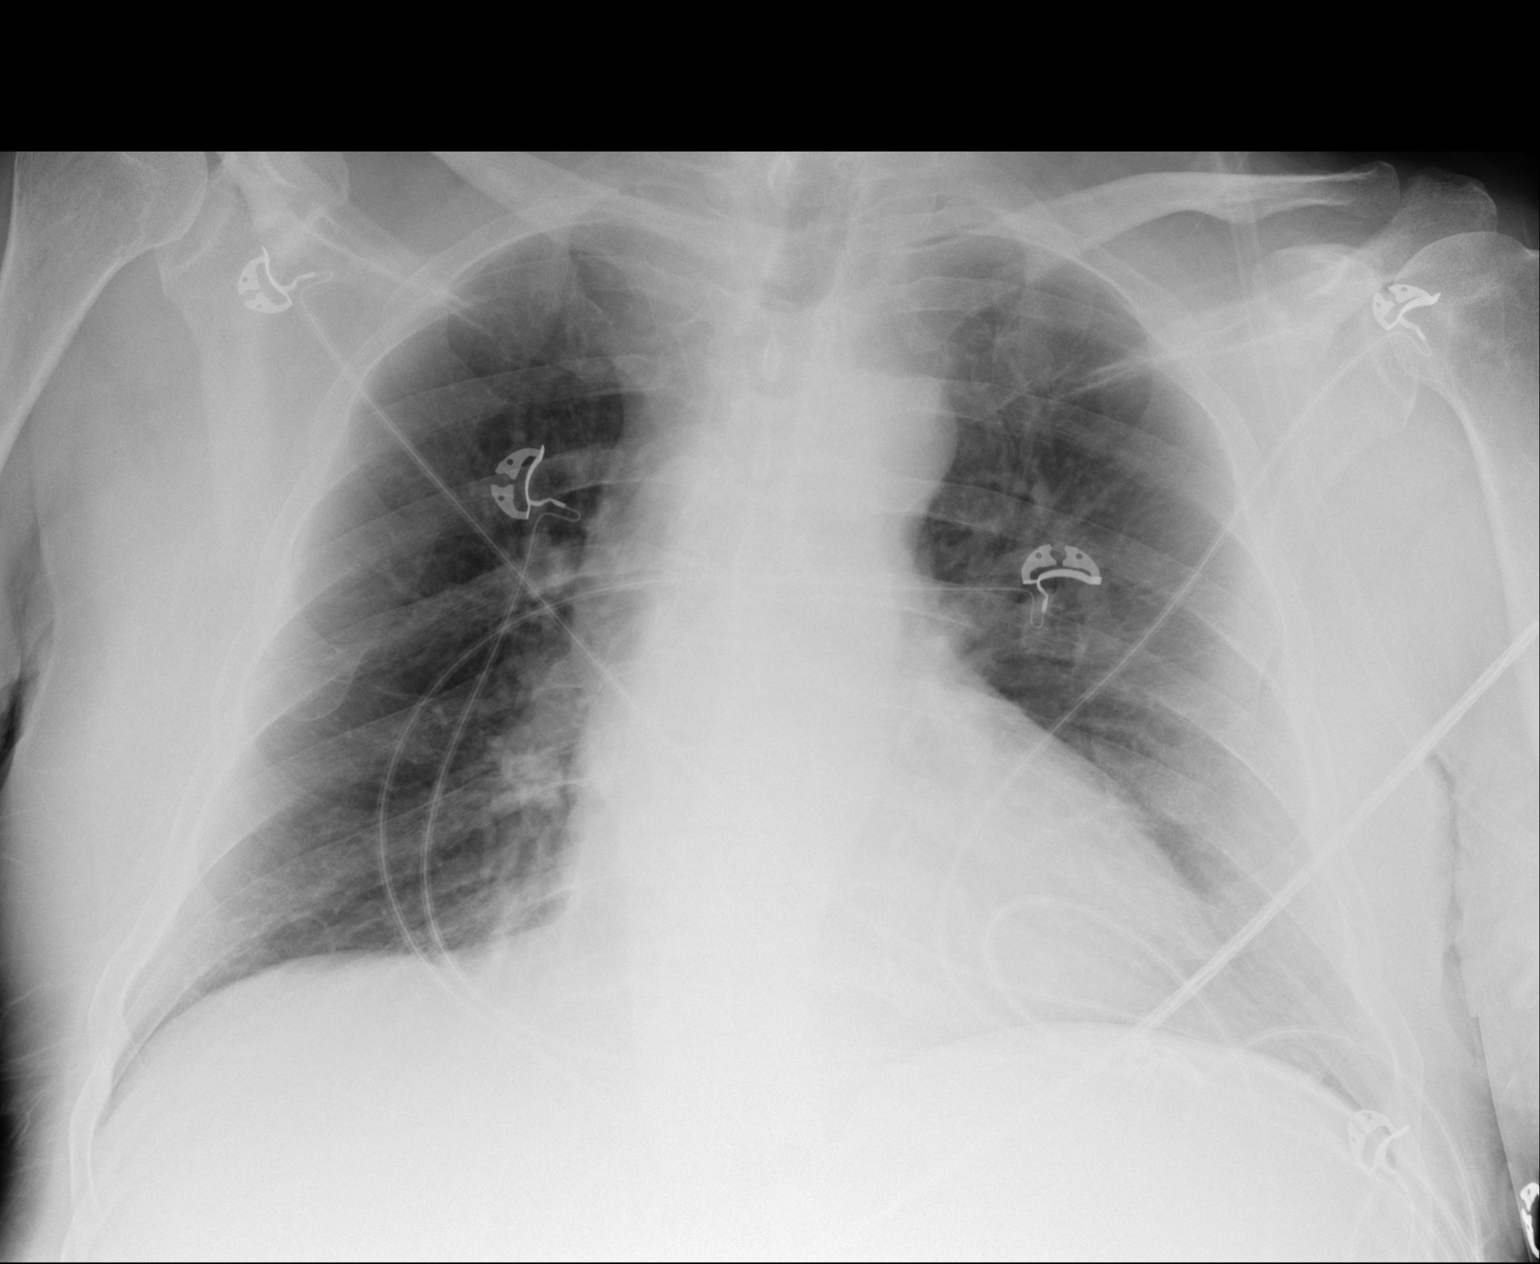

[1 of 1 positions shown; findings below may reference images not displayed]

FINDINGS: Normal heart size. Stable mediastinal contours. There is no edema,
consolidation, effusion, or pneumothorax. No acute osseous finding.

Artifact from EKG leads.
IMPRESSION: No evidence of active disease.  Stable compared to 8991.

## 2019-09-28 ENCOUNTER — Emergency Department (HOSPITAL_COMMUNITY): Payer: Medicare Other

## 2019-09-28 ENCOUNTER — Inpatient Hospital Stay (HOSPITAL_COMMUNITY)
Admission: EM | Admit: 2019-09-28 | Discharge: 2019-10-02 | DRG: 871 | Disposition: A | Discharge status: Skilled Nursing Facility | Payer: Medicare Other | Source: Skilled Nursing Facility | Attending: Internal Medicine | Admitting: Internal Medicine

## 2019-09-28 ENCOUNTER — Encounter (HOSPITAL_COMMUNITY): Payer: Self-pay

## 2019-09-28 DIAGNOSIS — Z96653 Presence of artificial knee joint, bilateral: Secondary | ICD-10-CM | POA: Diagnosis present

## 2019-09-28 DIAGNOSIS — Z20822 Contact with and (suspected) exposure to covid-19: Secondary | ICD-10-CM | POA: Diagnosis present

## 2019-09-28 DIAGNOSIS — J9601 Acute respiratory failure with hypoxia: Secondary | ICD-10-CM | POA: Diagnosis present

## 2019-09-28 DIAGNOSIS — Z807 Family history of other malignant neoplasms of lymphoid, hematopoietic and related tissues: Secondary | ICD-10-CM | POA: Diagnosis not present

## 2019-09-28 DIAGNOSIS — I10 Essential (primary) hypertension: Secondary | ICD-10-CM | POA: Diagnosis present

## 2019-09-28 DIAGNOSIS — A419 Sepsis, unspecified organism: Secondary | ICD-10-CM | POA: Diagnosis present

## 2019-09-28 DIAGNOSIS — Z79899 Other long term (current) drug therapy: Secondary | ICD-10-CM

## 2019-09-28 DIAGNOSIS — Z87891 Personal history of nicotine dependence: Secondary | ICD-10-CM

## 2019-09-28 DIAGNOSIS — M1711 Unilateral primary osteoarthritis, right knee: Secondary | ICD-10-CM | POA: Diagnosis present

## 2019-09-28 DIAGNOSIS — Z66 Do not resuscitate: Secondary | ICD-10-CM | POA: Diagnosis present

## 2019-09-28 DIAGNOSIS — J440 Chronic obstructive pulmonary disease with acute lower respiratory infection: Secondary | ICD-10-CM | POA: Diagnosis present

## 2019-09-28 DIAGNOSIS — Z515 Encounter for palliative care: Secondary | ICD-10-CM | POA: Diagnosis not present

## 2019-09-28 DIAGNOSIS — M1990 Unspecified osteoarthritis, unspecified site: Secondary | ICD-10-CM | POA: Diagnosis present

## 2019-09-28 DIAGNOSIS — F0391 Unspecified dementia with behavioral disturbance: Secondary | ICD-10-CM | POA: Diagnosis present

## 2019-09-28 DIAGNOSIS — J449 Chronic obstructive pulmonary disease, unspecified: Secondary | ICD-10-CM | POA: Diagnosis present

## 2019-09-28 DIAGNOSIS — H5461 Unqualified visual loss, right eye, normal vision left eye: Secondary | ICD-10-CM | POA: Diagnosis present

## 2019-09-28 DIAGNOSIS — Z8249 Family history of ischemic heart disease and other diseases of the circulatory system: Secondary | ICD-10-CM

## 2019-09-28 DIAGNOSIS — J189 Pneumonia, unspecified organism: Secondary | ICD-10-CM | POA: Diagnosis present

## 2019-09-28 DIAGNOSIS — R739 Hyperglycemia, unspecified: Secondary | ICD-10-CM | POA: Diagnosis not present

## 2019-09-28 DIAGNOSIS — K219 Gastro-esophageal reflux disease without esophagitis: Secondary | ICD-10-CM | POA: Diagnosis present

## 2019-09-28 DIAGNOSIS — R9431 Abnormal electrocardiogram [ECG] [EKG]: Secondary | ICD-10-CM | POA: Diagnosis present

## 2019-09-28 DIAGNOSIS — F329 Major depressive disorder, single episode, unspecified: Secondary | ICD-10-CM | POA: Diagnosis present

## 2019-09-28 DIAGNOSIS — R0603 Acute respiratory distress: Secondary | ICD-10-CM | POA: Diagnosis not present

## 2019-09-28 DIAGNOSIS — Z9049 Acquired absence of other specified parts of digestive tract: Secondary | ICD-10-CM | POA: Diagnosis not present

## 2019-09-28 DIAGNOSIS — Z801 Family history of malignant neoplasm of trachea, bronchus and lung: Secondary | ICD-10-CM | POA: Diagnosis not present

## 2019-09-28 DIAGNOSIS — Z7189 Other specified counseling: Secondary | ICD-10-CM | POA: Diagnosis not present

## 2019-09-28 DIAGNOSIS — D649 Anemia, unspecified: Secondary | ICD-10-CM | POA: Diagnosis present

## 2019-09-28 DIAGNOSIS — F03918 Unspecified dementia, unspecified severity, with other behavioral disturbance: Secondary | ICD-10-CM | POA: Diagnosis present

## 2019-09-28 DIAGNOSIS — E876 Hypokalemia: Secondary | ICD-10-CM | POA: Diagnosis not present

## 2019-09-28 LAB — I-STAT CHEM 8, ED
BUN: 23 mg/dL (ref 8–23)
Calcium, Ion: 1.11 mmol/L — ABNORMAL LOW (ref 1.15–1.40)
Chloride: 105 mmol/L (ref 98–111)
Creatinine, Ser: 1 mg/dL (ref 0.61–1.24)
Glucose, Bld: 189 mg/dL — ABNORMAL HIGH (ref 70–99)
HCT: 43 % (ref 39.0–52.0)
Hemoglobin: 14.6 g/dL (ref 13.0–17.0)
Potassium: 3.8 mmol/L (ref 3.5–5.1)
Sodium: 138 mmol/L (ref 135–145)
TCO2: 22 mmol/L (ref 22–32)

## 2019-09-28 LAB — I-STAT VENOUS BLOOD GAS, ED
Acid-Base Excess: 1 mmol/L (ref 0.0–2.0)
Bicarbonate: 24.1 mmol/L (ref 20.0–28.0)
Calcium, Ion: 1.11 mmol/L — ABNORMAL LOW (ref 1.15–1.40)
HCT: 44 % (ref 39.0–52.0)
Hemoglobin: 15 g/dL (ref 13.0–17.0)
O2 Saturation: 94 %
Potassium: 3.8 mmol/L (ref 3.5–5.1)
Sodium: 139 mmol/L (ref 135–145)
TCO2: 25 mmol/L (ref 22–32)
pCO2, Ven: 34 mmHg — ABNORMAL LOW (ref 44.0–60.0)
pH, Ven: 7.458 — ABNORMAL HIGH (ref 7.250–7.430)
pO2, Ven: 66 mmHg — ABNORMAL HIGH (ref 32.0–45.0)

## 2019-09-28 LAB — COMPREHENSIVE METABOLIC PANEL
ALT: 10 U/L (ref 0–44)
AST: 15 U/L (ref 15–41)
Albumin: 3 g/dL — ABNORMAL LOW (ref 3.5–5.0)
Alkaline Phosphatase: 45 U/L (ref 38–126)
Anion gap: 14 (ref 5–15)
BUN: 20 mg/dL (ref 8–23)
CO2: 21 mmol/L — ABNORMAL LOW (ref 22–32)
Calcium: 9.1 mg/dL (ref 8.9–10.3)
Chloride: 103 mmol/L (ref 98–111)
Creatinine, Ser: 1.21 mg/dL (ref 0.61–1.24)
GFR calc Af Amer: >60 mL/min (ref >60)
GFR calc non Af Amer: 57 mL/min — ABNORMAL LOW (ref >60)
Glucose, Bld: 187 mg/dL — ABNORMAL HIGH (ref 70–99)
Potassium: 3.9 mmol/L (ref 3.5–5.1)
Sodium: 138 mmol/L (ref 135–145)
Total Bilirubin: 1 mg/dL (ref 0.3–1.2)
Total Protein: 7.2 g/dL (ref 6.5–8.1)

## 2019-09-28 LAB — BRAIN NATRIURETIC PEPTIDE: B Natriuretic Peptide: 116 pg/mL — ABNORMAL HIGH (ref 0.0–100.0)

## 2019-09-28 LAB — CBC WITH DIFFERENTIAL/PLATELET
Abs Immature Granulocytes: 0.04 10*3/uL (ref 0.00–0.07)
Basophils Absolute: 0 10*3/uL (ref 0.0–0.1)
Basophils Relative: 0 %
Eosinophils Absolute: 0 10*3/uL (ref 0.0–0.5)
Eosinophils Relative: 0 %
HCT: 43.4 % (ref 39.0–52.0)
Hemoglobin: 13.6 g/dL (ref 13.0–17.0)
Immature Granulocytes: 0 %
Lymphocytes Relative: 11 %
Lymphs Abs: 1.4 10*3/uL (ref 0.7–4.0)
MCH: 27.8 pg (ref 26.0–34.0)
MCHC: 31.3 g/dL (ref 30.0–36.0)
MCV: 88.6 fL (ref 80.0–100.0)
Monocytes Absolute: 1.2 10*3/uL — ABNORMAL HIGH (ref 0.1–1.0)
Monocytes Relative: 10 %
Neutro Abs: 10 10*3/uL — ABNORMAL HIGH (ref 1.7–7.7)
Neutrophils Relative %: 79 %
Platelets: 405 10*3/uL — ABNORMAL HIGH (ref 150–400)
RBC: 4.9 MIL/uL (ref 4.22–5.81)
RDW: 13.5 % (ref 11.5–15.5)
WBC: 12.7 10*3/uL — ABNORMAL HIGH (ref 4.0–10.5)
nRBC: 0 % (ref 0.0–0.2)

## 2019-09-28 LAB — PROTIME-INR
INR: 1.1 (ref 0.8–1.2)
Prothrombin Time: 13.9 seconds (ref 11.4–15.2)

## 2019-09-28 LAB — LACTIC ACID, PLASMA: Lactic Acid, Venous: 1.9 mmol/L (ref 0.5–1.9)

## 2019-09-28 LAB — APTT: aPTT: 32 seconds (ref 24–36)

## 2019-09-28 LAB — SARS CORONAVIRUS 2 BY RT PCR (HOSPITAL ORDER, PERFORMED IN ~~LOC~~ HOSPITAL LAB): SARS Coronavirus 2: NEGATIVE

## 2019-09-28 MED ORDER — ENOXAPARIN SODIUM 40 MG/0.4ML ~~LOC~~ SOLN
40.0000 mg | SUBCUTANEOUS | Status: DC
  Administered 2019-09-28 – 2019-10-02 (×5): 40 mg via SUBCUTANEOUS
  Filled 2019-09-28 (×5): qty 0.4

## 2019-09-28 MED ORDER — LACTATED RINGERS IV SOLN: INTRAVENOUS | Status: DC

## 2019-09-28 MED ORDER — ALBUTEROL SULFATE (2.5 MG/3ML) 0.083% IN NEBU: 2.5000 mg | INHALATION_SOLUTION | RESPIRATORY_TRACT | Status: DC | PRN

## 2019-09-28 MED ORDER — VANCOMYCIN HCL IN DEXTROSE 1-5 GM/200ML-% IV SOLN: 1000.0000 mg | Freq: Once | INTRAVENOUS | Status: DC

## 2019-09-28 MED ORDER — SODIUM CHLORIDE 0.9 % IV SOLN: 2.0000 g | Freq: Three times a day (TID) | INTRAVENOUS | Status: DC

## 2019-09-28 MED ORDER — SODIUM CHLORIDE 0.9 % IV SOLN
2.0000 g | INTRAVENOUS | Status: DC
  Administered 2019-09-28: 2 g via INTRAVENOUS
  Filled 2019-09-28: qty 20

## 2019-09-28 MED ORDER — LACTATED RINGERS IV BOLUS (SEPSIS)
1000.0000 mL | Freq: Once | INTRAVENOUS | Status: AC
  Administered 2019-09-28: 1000 mL via INTRAVENOUS

## 2019-09-28 MED ORDER — VANCOMYCIN HCL IN DEXTROSE 1-5 GM/200ML-% IV SOLN: 1000.0000 mg | Freq: Two times a day (BID) | INTRAVENOUS | Status: DC

## 2019-09-28 MED ORDER — SODIUM CHLORIDE 0.9 % IV SOLN
2.0000 g | Freq: Once | INTRAVENOUS | Status: AC
  Administered 2019-09-28: 2 g via INTRAVENOUS
  Filled 2019-09-28: qty 2

## 2019-09-28 MED ORDER — LACTATED RINGERS IV BOLUS (SEPSIS)
500.0000 mL | Freq: Once | INTRAVENOUS | Status: AC
  Administered 2019-09-28: 500 mL via INTRAVENOUS

## 2019-09-28 MED ORDER — LACTATED RINGERS IV SOLN: INTRAVENOUS | Status: AC

## 2019-09-28 MED ORDER — VANCOMYCIN HCL 1750 MG/350ML IV SOLN
1750.0000 mg | Freq: Once | INTRAVENOUS | Status: AC
  Administered 2019-09-28: 1750 mg via INTRAVENOUS
  Filled 2019-09-28: qty 350

## 2019-09-28 MED ORDER — SODIUM CHLORIDE 0.9 % IV SOLN
100.0000 mg | Freq: Two times a day (BID) | INTRAVENOUS | Status: DC
  Administered 2019-09-29 – 2019-10-02 (×9): 100 mg via INTRAVENOUS
  Filled 2019-09-28 (×10): qty 100
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
## 2019-09-28 NOTE — ED Triage Notes (Signed)
Pt arrived to ED via [facility] from SNF. EMS reports that staff reported abnormal breathing at 1730 and called EMS. Pt is nonverbal and minimally responsive at baseline. They report he normally will open his eyes. Pt lethargic w/ EMS and aspirated en route. 125mg  solumedrol, 2g mag, 0.5 duoneb given by EMS. Pt arrived on NRB

## 2019-09-28 NOTE — Progress Notes (Signed)
Pharmacy Antibiotic Note  Willie Turner is a 79 y.o. male admitted on 09/28/2019 with sepsis. Patient is febrile, tachypnic w/ O2 sats in the low 80s. Lactic acid is 1.9 and WBC is 12.9. SCr is slightly elevated above baseline ~1. Pharmacy has been consulted for vancomycin and cefepime dosing.  Plan: Vancomycin 1,750mg  IV x1 Vancomycin 1,000 IV every 12 hours.  Goal trough 15-20 mcg/mL.  Cefepime 2g IV every 8 hours Follow up with cultures, antibiotic de-escalation and LOT Monitor renal function and clinical progress    No data recorded.  Recent Labs  Lab 09/28/19 2006 09/28/19 2011  WBC 12.7*  --   CREATININE 1.21 1.00  LATICACIDVEN 1.9  --     CrCl cannot be calculated (Unknown ideal weight.).    No Known Allergies  Antimicrobials this admission: 9/9 vancomycin >>  9/9 cefepime >>   Dose adjustments this admission: N/a  Microbiology results: 9/9 BCx: pending 9/9 UCx: pending  Thank you for allowing pharmacy to be a part of this patient's care. Yvetta Coder, PharmD PGY1 Acute Care Pharmacy Resident Please refer to [facility] for unit-specific pharmacist

## 2019-09-29 DIAGNOSIS — J9601 Acute respiratory failure with hypoxia: Secondary | ICD-10-CM

## 2019-09-29 DIAGNOSIS — J189 Pneumonia, unspecified organism: Secondary | ICD-10-CM

## 2019-09-29 DIAGNOSIS — A419 Sepsis, unspecified organism: Principal | ICD-10-CM

## 2019-09-29 DIAGNOSIS — Z7189 Other specified counseling: Secondary | ICD-10-CM

## 2019-09-29 DIAGNOSIS — J449 Chronic obstructive pulmonary disease, unspecified: Secondary | ICD-10-CM

## 2019-09-29 DIAGNOSIS — F0391 Unspecified dementia with behavioral disturbance: Secondary | ICD-10-CM

## 2019-09-29 DIAGNOSIS — Z515 Encounter for palliative care: Secondary | ICD-10-CM

## 2019-09-29 LAB — URINALYSIS, ROUTINE W REFLEX MICROSCOPIC
Bacteria, UA: NONE SEEN
Bilirubin Urine: NEGATIVE
Glucose, UA: 150 mg/dL — AB
Ketones, ur: 20 mg/dL — AB
Leukocytes,Ua: NEGATIVE
Nitrite: NEGATIVE
Protein, ur: NEGATIVE mg/dL
Specific Gravity, Urine: 1.017 (ref 1.005–1.030)
pH: 5 (ref 5.0–8.0)

## 2019-09-29 LAB — BASIC METABOLIC PANEL
Anion gap: 12 (ref 5–15)
BUN: 17 mg/dL (ref 8–23)
CO2: 22 mmol/L (ref 22–32)
Calcium: 8.5 mg/dL — ABNORMAL LOW (ref 8.9–10.3)
Chloride: 104 mmol/L (ref 98–111)
Creatinine, Ser: 1.07 mg/dL (ref 0.61–1.24)
GFR calc Af Amer: >60 mL/min (ref >60)
GFR calc non Af Amer: >60 mL/min (ref >60)
Glucose, Bld: 203 mg/dL — ABNORMAL HIGH (ref 70–99)
Potassium: 3.7 mmol/L (ref 3.5–5.1)
Sodium: 138 mmol/L (ref 135–145)

## 2019-09-29 LAB — CBC WITH DIFFERENTIAL/PLATELET
Abs Immature Granulocytes: 0.03 10*3/uL (ref 0.00–0.07)
Basophils Absolute: 0 10*3/uL (ref 0.0–0.1)
Basophils Relative: 0 %
Eosinophils Absolute: 0 10*3/uL (ref 0.0–0.5)
Eosinophils Relative: 0 %
HCT: 40.4 % (ref 39.0–52.0)
Hemoglobin: 12.5 g/dL — ABNORMAL LOW (ref 13.0–17.0)
Immature Granulocytes: 0 %
Lymphocytes Relative: 9 %
Lymphs Abs: 0.7 10*3/uL (ref 0.7–4.0)
MCH: 28 pg (ref 26.0–34.0)
MCHC: 30.9 g/dL (ref 30.0–36.0)
MCV: 90.6 fL (ref 80.0–100.0)
Monocytes Absolute: 0.7 10*3/uL (ref 0.1–1.0)
Monocytes Relative: 9 %
Neutro Abs: 5.7 10*3/uL (ref 1.7–7.7)
Neutrophils Relative %: 82 %
Platelets: 289 10*3/uL (ref 150–400)
RBC: 4.46 MIL/uL (ref 4.22–5.81)
RDW: 13.4 % (ref 11.5–15.5)
WBC Morphology: INCREASED
WBC: 7 10*3/uL (ref 4.0–10.5)
nRBC: 0 % (ref 0.0–0.2)

## 2019-09-29 LAB — MRSA PCR SCREENING: MRSA by PCR: NEGATIVE

## 2019-09-29 LAB — PROCALCITONIN: Procalcitonin: 3.85 ng/mL

## 2019-09-29 LAB — STREP PNEUMONIAE URINARY ANTIGEN: Strep Pneumo Urinary Antigen: NEGATIVE

## 2019-09-29 MED ORDER — SODIUM CHLORIDE 0.9 % IV SOLN
2.0000 g | Freq: Two times a day (BID) | INTRAVENOUS | Status: DC
  Administered 2019-09-29 – 2019-09-30 (×3): 2 g via INTRAVENOUS
  Filled 2019-09-29 (×4): qty 2

## 2019-09-29 MED ORDER — IPRATROPIUM-ALBUTEROL 0.5-2.5 (3) MG/3ML IN SOLN
3.0000 mL | Freq: Four times a day (QID) | RESPIRATORY_TRACT | Status: DC
  Administered 2019-09-29: 3 mL via RESPIRATORY_TRACT
  Filled 2019-09-29: qty 3

## 2019-09-29 MED ORDER — IPRATROPIUM-ALBUTEROL 0.5-2.5 (3) MG/3ML IN SOLN
3.0000 mL | Freq: Three times a day (TID) | RESPIRATORY_TRACT | Status: DC
  Administered 2019-09-29 – 2019-10-02 (×10): 3 mL via RESPIRATORY_TRACT
  Filled 2019-09-29 (×10): qty 3

## 2019-09-29 NOTE — Progress Notes (Signed)
Patient mews is yellow due to LOC, vitals are normal

## 2019-09-29 NOTE — Progress Notes (Addendum)
PROGRESS NOTE    Willie Turner  JEH:[id number] DOB: October 01, 1940 DOA: 09/28/2019 PCP: Willie Found, MD   Brief Narrative: 79 year old with past medical history significant for dementia, COPD presented from skilled nursing facility with dyspnea.  At baseline patient is only able to open his eyes and eat when he is fed, but otherwise he is not interactive.  Patient developed increased work of breathing and hypoxemia.  He was treated with IV Solu-Medrol and nebulizer and placed on the nonrebreather by EMS.  Patient admitted with acute hypoxic respiratory failure secondary to pneumonia and sepsis secondary to pneumonia.   Assessment & Plan:   Principal Problem:   Sepsis due to pneumonia Willie Turner) Active Problems:   COPD (chronic obstructive pulmonary disease) (HCC)   Dementia with behavioral disturbance (HCC)   Acute respiratory failure with hypoxia (HCC)   Prolonged QT interval  1-Sepsis secondary to pneumonia: -Patient presented with, tachypnea respiration rate 37, leukocytosis, tachycardia.  Pneumonia.  Chest x-ray showed left lower lobe consolidation consistent with airspace disease. -Continue with IV fluids -Continue with IV cefepime and doxycycline -Blood cultures  Acute hypoxic respiratory failure secondary to pneumonia: Currently on 10 L of oxygen. Chest x-ray showed left lower lobe consolidation. Speech therapist evaluation, dysphagia 1 diet  COPD: Continue with nebulizer as needed  Dementia: Advanced at baseline, he is nonverbal only open eyes and need assistant with feeding.  Prolonged QT: Avoid QT prolongation medication.  Check mag level Hyperglycemia: Might have been related to dose of IV Solu-Medrol Monitor CBG a.m. labs    Estimated body mass index is 24.8 kg/m as calculated from the following:   Height as of this encounter: 5\' 10"  (1.778 m).   Weight as of this encounter: 78.4 kg.   DVT prophylaxis: Lovenox Code Status: DNR Family Communication: , step  son, POA is patient's wife but she has dementia, Willie Turner is next in line, and he has been helping with Willie Turner care.   Disposition Plan:  Status is: Inpatient  Remains inpatient appropriate because:IV treatments appropriate due to intensity of illness or inability to take PO   Dispo: The patient is from: SNF              Anticipated d/c is to: SNF              Anticipated d/c date is: 3 days              Patient currently is not medically stable to d/c.        Consultants:   None  Procedures:   None  Antimicrobials:  Cefepime and doxycycline 9/9  Subjective: He is alert nonverbal.  Objective: Vitals:   09/28/19 2230 09/28/19 2346 09/29/19 0007 09/29/19 0200  BP: (!) 142/81   134/76  Pulse: (!) 106   94  Resp: (!) 21  16 15   Temp:  97.8 F (36.6 C)    TempSrc:  Oral    SpO2: 99%  95% 98%  Weight:      Height:        Intake/Output Summary (Last 24 hours) at 09/29/2019 0758 Last data filed at 09/28/2019 2234 Gross per 24 hour  Intake 2737.68 ml  Output --  Net 2737.68 ml   Filed Weights   09/28/19 2100  Weight: 78.4 kg    Examination:  General exam: Appears calm and comfortable  Respiratory system: Bilateral rhonchorous Cardiovascular system: S1 & S2 heard, RRR. No JVD, murmurs, rubs, gallops or clicks. No pedal edema. Gastrointestinal system:  Abdomen is nondistended, soft and nontender. No organomegaly or masses felt. Normal bowel sounds heard. Central nervous system: Alert not following commands, nonverbal Extremities: no edema   Data Reviewed: I have personally reviewed following labs and imaging studies  CBC: Recent Labs  Lab 09/28/19 2006 09/28/19 2011 09/29/19 0252  WBC 12.7*  --  7.0  NEUTROABS 10.0*  --  5.7  HGB 13.6 14.6  15.0 12.5*  HCT 43.4 43.0  44.0 40.4  MCV 88.6  --  90.6  PLT 405*  --  289   Basic Metabolic Panel: Recent Labs  Lab 09/28/19 2006 09/28/19 2011 09/29/19 0252  NA 138 138  139 138  K 3.9 3.8  3.8 3.7   CL 103 105 104  CO2 21*  --  22  GLUCOSE 187* 189* 203*  BUN 20 23 17   CREATININE 1.21 1.00 1.07  CALCIUM 9.1  --  8.5*   GFR: Estimated Creatinine Clearance: 58.7 mL/min (by C-G formula based on SCr of 1.07 mg/dL). Liver Function Tests: Recent Labs  Lab 09/28/19 2006  AST 15  ALT 10  ALKPHOS 45  BILITOT 1.0  PROT 7.2  ALBUMIN 3.0*   No results for input(s): LIPASE, AMYLASE in the last 168 hours. No results for input(s): AMMONIA in the last 168 hours. Coagulation Profile: Recent Labs  Lab 09/28/19 2006  INR 1.1   Cardiac Enzymes: No results for input(s): CKTOTAL, CKMB, CKMBINDEX, TROPONINI in the last 168 hours. BNP (last 3 results) No results for input(s): PROBNP in the last 8760 hours. HbA1C: No results for input(s): HGBA1C in the last 72 hours. CBG: No results for input(s): GLUCAP in the last 168 hours. Lipid Profile: No results for input(s): CHOL, HDL, LDLCALC, TRIG, CHOLHDL, LDLDIRECT in the last 72 hours. Thyroid Function Tests: No results for input(s): TSH, T4TOTAL, FREET4, T3FREE, THYROIDAB in the last 72 hours. Anemia Panel: No results for input(s): VITAMINB12, FOLATE, FERRITIN, TIBC, IRON, RETICCTPCT in the last 72 hours. Sepsis Labs: Recent Labs  Lab 09/28/19 2006 09/29/19 0252  PROCALCITON  --  3.85  LATICACIDVEN 1.9  --     Recent Results (from the past 240 hour(s))  SARS Coronavirus 2 by RT PCR (Turner order, performed in [facility] lab) Nasopharyngeal Nasopharyngeal Swab     Status: None   Collection Time: 09/28/19  8:03 PM   Specimen: Nasopharyngeal Swab  Result Value Ref Range Status   SARS Coronavirus 2 NEGATIVE NEGATIVE Final    Comment: (NOTE) SARS-CoV-2 target nucleic acids are NOT DETECTED.  The SARS-CoV-2 RNA is generally detectable in upper and lower respiratory specimens during the acute phase of infection. The lowest concentration of SARS-CoV-2 viral copies this assay can detect is 250 copies / mL. A negative  result does not preclude SARS-CoV-2 infection and should not be used as the sole basis for treatment or other patient management decisions.  A negative result may occur with improper specimen collection / handling, submission of specimen other than nasopharyngeal swab, presence of viral mutation(s) within the areas targeted by this assay, and inadequate number of viral copies (<250 copies / mL). A negative result must be combined with clinical observations, patient history, and epidemiological information.  Fact Sheet for Patients:   11/28/19  Fact Sheet for Healthcare Providers: BoilerBrush.com.cy  This test is not yet approved or  cleared by the https://pope.com/ FDA and has been authorized for detection and/or diagnosis of SARS-CoV-2 by FDA under an Emergency Use Authorization (EUA).  This EUA will  remain in effect (meaning this test can be used) for the duration of the COVID-19 declaration under Section 564(b)(1) of the Act, 21 U.S.C. section 360bbb-3(b)(1), unless the authorization is terminated or revoked sooner.  Performed at [facility], [address], [place], [place] [address]          Radiology Studies: DG Chest Portable 1 View  Result Date: 09/28/2019 CLINICAL DATA:  Shortness of breath EXAM: PORTABLE CHEST 1 VIEW COMPARISON:  02/26/2017 FINDINGS: Single frontal view of the chest demonstrates a stable cardiac silhouette. There is left lower lobe consolidation with likely trace left pleural effusion. No pneumothorax. No acute bony abnormalities. IMPRESSION: 1. Left lower lobe consolidation consistent with airspace disease or atelectasis. 2. Trace left pleural effusion. Electronically Signed   By: Sharlet Salina M.D.   On: 09/28/2019 20:44        Scheduled Meds: . enoxaparin (LOVENOX) injection  40 mg Subcutaneous Q24H   Continuous Infusions: . cefTRIAXone (ROCEPHIN)  IV Stopped (09/29/19 0043)  .  doxycycline (VIBRAMYCIN) IV Stopped (09/29/19 0249)  . lactated ringers 75 mL/hr at 09/28/19 2357     LOS: 1 day    Time spent: 35 minutes.     Alba Cory, MD [facility]   If 7PM-7AM, please contact night-coverage [email or web address]  09/29/2019, 7:58 AM

## 2019-09-29 NOTE — Progress Notes (Signed)
Brief Palliative Care Progress Note:  PMT consult received and chart reviewed.   Went to visit patient at bedside - no family present. Patient was awake but non-verbal and not able to participate in conversation. No signs or non-verbal gestures of pain or discomfort noted. No respiratory distress, increased work of breathing, or secretions noted.   Attempted to call Jillyn Hidden many times throughout the afternoon and only received a busy signal - was not able to leave voicemail. Attempted to call spouse with no answer; however, per notes patient's wife also has dementia and is unable to provide information.   PMT will continue to reach out to [place].   Thank you for allowing PMT to assist in the care of this patient.  Robecca Fulgham M. Katrinka Blazing [facility] Palliative Medicine Team Team Phone: [phone]  NO CHARGE

## 2019-09-29 NOTE — ED Notes (Signed)
Daughter Almyra Free would like an update [phone]
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
## 2019-09-29 NOTE — Progress Notes (Signed)
Pharmacy Antibiotic Note  Willie Turner is a 79 y.o. male admitted on 09/28/2019 with concern for PNA. Pharmacy has been consulted for Cefepime dosing along with Doxy per MD.   SCr 1.07, CrCl~55-60 ml/min  Plan: - Restart Cefepime at 2g IV every 12 hours - Doxy per MD - Will continue to follow renal function, culture results, LOT, and antibiotic de-escalation plans   Height: 5\' 10"  (177.8 cm) Weight: 78.4 kg (172 lb 13.5 oz) IBW/kg (Calculated) : 73  Temp (24hrs), Avg:97.8 F (36.6 C), Min:97.8 F (36.6 C), Max:97.8 F (36.6 C)  Recent Labs  Lab 09/28/19 2006 09/28/19 2011 09/29/19 0252  WBC 12.7*  --  7.0  CREATININE 1.21 1.00 1.07  LATICACIDVEN 1.9  --   --     Estimated Creatinine Clearance: 58.7 mL/min (by C-G formula based on SCr of 1.07 mg/dL).    No Known Allergies  Antimicrobials this admission: 9/9 vancomycin >>  9/9 cefepime >>   Dose adjustments this admission: N/a  Microbiology results: 9/9 BCx: pending 9/9 UCx: pending  Thank you for allowing pharmacy to be a part of this patient's care.  11/9, PharmD, BCPS Clinical Pharmacist Clinical phone for 09/29/2019: [phone] 09/29/2019 8:08 AM   **Pharmacist phone directory can now be found on [email or web address] ([email or web address]).  Listed under [facility].

## 2019-09-30 LAB — CBC WITH DIFFERENTIAL/PLATELET
Abs Immature Granulocytes: 0.02 10*3/uL (ref 0.00–0.07)
Basophils Absolute: 0 10*3/uL (ref 0.0–0.1)
Basophils Relative: 0 %
Eosinophils Absolute: 0 10*3/uL (ref 0.0–0.5)
Eosinophils Relative: 0 %
HCT: 34.2 % — ABNORMAL LOW (ref 39.0–52.0)
Hemoglobin: 10.5 g/dL — ABNORMAL LOW (ref 13.0–17.0)
Immature Granulocytes: 0 %
Lymphocytes Relative: 13 %
Lymphs Abs: 1.1 10*3/uL (ref 0.7–4.0)
MCH: 27.2 pg (ref 26.0–34.0)
MCHC: 30.7 g/dL (ref 30.0–36.0)
MCV: 88.6 fL (ref 80.0–100.0)
Monocytes Absolute: 0.9 10*3/uL (ref 0.1–1.0)
Monocytes Relative: 11 %
Neutro Abs: 6.1 10*3/uL (ref 1.7–7.7)
Neutrophils Relative %: 76 %
Platelets: 254 10*3/uL (ref 150–400)
RBC: 3.86 MIL/uL — ABNORMAL LOW (ref 4.22–5.81)
RDW: 13.4 % (ref 11.5–15.5)
WBC: 8.2 10*3/uL (ref 4.0–10.5)
nRBC: 0 % (ref 0.0–0.2)

## 2019-09-30 LAB — BASIC METABOLIC PANEL
Anion gap: 10 (ref 5–15)
BUN: 19 mg/dL (ref 8–23)
CO2: 22 mmol/L (ref 22–32)
Calcium: 8.6 mg/dL — ABNORMAL LOW (ref 8.9–10.3)
Chloride: 107 mmol/L (ref 98–111)
Creatinine, Ser: 0.86 mg/dL (ref 0.61–1.24)
GFR calc Af Amer: >60 mL/min (ref >60)
GFR calc non Af Amer: >60 mL/min (ref >60)
Glucose, Bld: 99 mg/dL (ref 70–99)
Potassium: 4.2 mmol/L (ref 3.5–5.1)
Sodium: 139 mmol/L (ref 135–145)

## 2019-09-30 LAB — MAGNESIUM: Magnesium: 1.8 mg/dL (ref 1.7–2.4)

## 2019-09-30 LAB — LEGIONELLA PNEUMOPHILA SEROGP 1 UR AG: L. pneumophila Serogp 1 Ur Ag: NEGATIVE

## 2019-09-30 LAB — URINE CULTURE

## 2019-09-30 LAB — PROCALCITONIN: Procalcitonin: 3.91 ng/mL

## 2019-09-30 MED ORDER — SODIUM CHLORIDE 0.9 % IV SOLN
2.0000 g | Freq: Three times a day (TID) | INTRAVENOUS | Status: DC
  Administered 2019-09-30 – 2019-10-02 (×7): 2 g via INTRAVENOUS
  Filled 2019-09-30 (×7): qty 2

## 2019-09-30 MED ORDER — MAGNESIUM SULFATE 2 GM/50ML IV SOLN
2.0000 g | Freq: Once | INTRAVENOUS | Status: AC
  Administered 2019-09-30: 2 g via INTRAVENOUS
  Filled 2019-09-30: qty 50

## 2019-09-30 NOTE — Progress Notes (Signed)
OT Cancellation Note  Patient Details Name: Willie Turner MRN: [id number] DOB: 11-23-1940   Cancelled Treatment:    Reason Eval/Treat Not Completed: OT screened, no needs identified, will sign off. Per chart review, pt has advanced dementia and is total A with basic ADLs at baseline.   Raynald Kemp, OT Acute Rehabilitation Services Pager: [phone] Office: [phone]  09/30/2019, 11:35 AM

## 2019-09-30 NOTE — Progress Notes (Signed)
PROGRESS NOTE    Willie Turner  VQM:[id number] DOB: [birth year] DOA: 09/28/2019 PCP: Willie Found, MD   Brief Narrative: 79 year old with past medical history significant for dementia, COPD presented from skilled nursing facility with dyspnea.  At baseline patient is only able to open his eyes and eat when he is fed, but otherwise he is not interactive.  Patient developed increased work of breathing and hypoxemia.  He was treated with IV Solu-Medrol and nebulizer and placed on the nonrebreather by EMS.  Patient admitted with acute hypoxic respiratory failure secondary to pneumonia and sepsis secondary to pneumonia.   Assessment & Plan:   Principal Problem:   Sepsis due to pneumonia [facility]) Active Problems:   COPD (chronic obstructive pulmonary disease) (HCC)   Dementia with behavioral disturbance (HCC)   Acute respiratory failure with hypoxia (HCC)   Prolonged QT interval   Community acquired pneumonia of left lower lobe of lung   Palliative care by specialist   Goals of care, counseling/discussion  1-Sepsis secondary to pneumonia: -Patient presented with, tachypnea respiration rate 37, leukocytosis, tachycardia.  Pneumonia.  Chest x-ray showed left lower lobe consolidation consistent with airspace disease. -Continue with IV cefepime and doxycycline -Blood cultures;No growth to date.   Acute hypoxic respiratory failure secondary to pneumonia: Currently on 10 L of oxygen. Will decrease oxygen to 8 L and monitor.  Chest x-ray showed left lower lobe consolidation. Speech therapist evaluation, dysphagia 1 diet  COPD: Continue with nebulizer as needed  Dementia: Advanced at baseline, he is nonverbal only open eyes and need assistant with feeding. He was able to say yes and no today.   Prolonged QT: Avoid QT prolongation medication. Mag at 1.8. will replete IV.   Hyperglycemia: Might have been related to dose of IV Solu-Medrol Monitor CBG a.m. labs Resolved.    Estimated body  mass index is 24.8 kg/m as calculated from the following:   Height as of this encounter: 5\' 10"  (1.778 m).   Weight as of this encounter: 78.4 kg.   DVT prophylaxis: Lovenox Code Status: DNR Family Communication: , step son, POA is patient's wife but she has dementia, gary is next in line, and he has been helping with [name] care.   Disposition Plan:  Status is: Inpatient  Remains inpatient appropriate because:IV treatments appropriate due to intensity of illness or inability to take PO   Dispo: The patient is from: SNF              Anticipated d/c is to: SNF              Anticipated d/c date is: 3 days              Patient currently is not medically stable to d/c.        Consultants:   None  Procedures:   None  Antimicrobials:  Cefepime and doxycycline 9/9  Subjective: He is not distress. He say yes and no today.   Objective: Vitals:   09/29/19 2023 09/30/19 0030 09/30/19 0444 09/30/19 0739  BP:  (!) 141/86 135/85 (!) 141/80  Pulse:  80 84 87  Resp:  18 16 18   Temp:  98.7 F (37.1 C) 99 F (37.2 C) 99.1 F (37.3 C)  TempSrc:  Oral Oral Oral  SpO2: 96% 100% 99% 93%  Weight:      Height:        Intake/Output Summary (Last 24 hours) at 09/30/2019 1119 Last data filed at 09/30/2019 0700 Gross per 24  hour  Intake 250 ml  Output --  Net 250 ml   Filed Weights   09/28/19 2100  Weight: 78.4 kg    Examination:  General exam: NAD Respiratory system: Bilateral ronchus.  Cardiovascular system: S 1, S 2 RRR Gastrointestinal system: BS present, soft, nt Central nervous system: alert.  Extremities: no edema   Data Reviewed: I have personally reviewed following labs and imaging studies  CBC: Recent Labs  Lab 09/28/19 2006 09/28/19 2011 09/29/19 0252 09/30/19 0302  WBC 12.7*  --  7.0 8.2  NEUTROABS 10.0*  --  5.7 6.1  HGB 13.6 14.6  15.0 12.5* 10.5*  HCT 43.4 43.0  44.0 40.4 34.2*  MCV 88.6  --  90.6 88.6  PLT 405*  --  289 254    Basic Metabolic Panel: Recent Labs  Lab 09/28/19 2006 09/28/19 2011 09/29/19 0252 09/30/19 0302  NA 138 138  139 138 139  K 3.9 3.8  3.8 3.7 4.2  CL 103 105 104 107  CO2 21*  --  22 22  GLUCOSE 187* 189* 203* 99  BUN 20 23 17 19   CREATININE 1.21 1.00 1.07 0.86  CALCIUM 9.1  --  8.5* 8.6*  MG  --   --   --  1.8   GFR: Estimated Creatinine Clearance: 73.1 mL/min (by C-G formula based on SCr of 0.86 mg/dL). Liver Function Tests: Recent Labs  Lab 09/28/19 2006  AST 15  ALT 10  ALKPHOS 45  BILITOT 1.0  PROT 7.2  ALBUMIN 3.0*   No results for input(s): LIPASE, AMYLASE in the last 168 hours. No results for input(s): AMMONIA in the last 168 hours. Coagulation Profile: Recent Labs  Lab 09/28/19 2006  INR 1.1   Cardiac Enzymes: No results for input(s): CKTOTAL, CKMB, CKMBINDEX, TROPONINI in the last 168 hours. BNP (last 3 results) No results for input(s): PROBNP in the last 8760 hours. HbA1C: No results for input(s): HGBA1C in the last 72 hours. CBG: No results for input(s): GLUCAP in the last 168 hours. Lipid Profile: No results for input(s): CHOL, HDL, LDLCALC, TRIG, CHOLHDL, LDLDIRECT in the last 72 hours. Thyroid Function Tests: No results for input(s): TSH, T4TOTAL, FREET4, T3FREE, THYROIDAB in the last 72 hours. Anemia Panel: No results for input(s): VITAMINB12, FOLATE, FERRITIN, TIBC, IRON, RETICCTPCT in the last 72 hours. Sepsis Labs: Recent Labs  Lab 09/28/19 2006 09/29/19 0252 09/30/19 0302  PROCALCITON  --  3.85 3.91  LATICACIDVEN 1.9  --   --     Recent Results (from the past 240 hour(s))  SARS Coronavirus 2 by RT PCR (hospital order, performed in [facility] hospital lab) Nasopharyngeal Nasopharyngeal Swab     Status: None   Collection Time: 09/28/19  8:03 PM   Specimen: Nasopharyngeal Swab  Result Value Ref Range Status   SARS Coronavirus 2 NEGATIVE NEGATIVE Final    Comment: (NOTE) SARS-CoV-2 target nucleic acids are NOT DETECTED.  The  SARS-CoV-2 RNA is generally detectable in upper and lower respiratory specimens during the acute phase of infection. The lowest concentration of SARS-CoV-2 viral copies this assay can detect is 250 copies / mL. A negative result does not preclude SARS-CoV-2 infection and should not be used as the sole basis for treatment or other patient management decisions.  A negative result may occur with improper specimen collection / handling, submission of specimen other than nasopharyngeal swab, presence of viral mutation(s) within the areas targeted by this assay, and inadequate number of viral copies (<250 copies /  mL). A negative result must be combined with clinical observations, patient history, and epidemiological information.  Fact Sheet for Patients:   BoilerBrush.com.cyhttps://www.fda.gov/media/136312/download  Fact Sheet for Healthcare Providers: https://pope.com/https://www.fda.gov/media/136313/download  This test is not yet approved or  cleared by the [place] FDA and has been authorized for detection and/or diagnosis of SARS-CoV-2 by FDA under an Emergency Use Authorization (EUA).  This EUA will remain in effect (meaning this test can be used) for the duration of the COVID-19 declaration under Section 564(b)(1) of the Act, 21 U.S.C. section 360bbb-3(b)(1), unless the authorization is terminated or revoked sooner.  Performed at [facility], [address] [place], [place] [address]   Blood Culture (routine x 2)     Status: None (Preliminary result)   Collection Time: 09/28/19  8:07 PM   Specimen: BLOOD  Result Value Ref Range Status   Specimen Description BLOOD RIGHT ANTECUBITAL  Final   Special Requests   Final    BOTTLES DRAWN AEROBIC AND ANAEROBIC Blood Culture results may not be optimal due to an excessive volume of blood received in culture bottles   Culture   Final    NO GROWTH < 12 HOURS Performed at [facility], [address] [place], [place] [address]    Report Status PENDING   Incomplete  Blood Culture (routine x 2)     Status: None (Preliminary result)   Collection Time: 09/28/19  8:07 PM   Specimen: BLOOD LEFT ARM  Result Value Ref Range Status   Specimen Description BLOOD LEFT ARM  Final   Special Requests   Final    BOTTLES DRAWN AEROBIC ONLY Blood Culture results may not be optimal due to an inadequate volume of blood received in culture bottles   Culture   Final    NO GROWTH < 12 HOURS Performed at [facility], [address] [place], [place] [address]    Report Status PENDING  Incomplete  Urine culture     Status: Abnormal   Collection Time: 09/29/19  4:19 AM   Specimen: In/Out Cath Urine  Result Value Ref Range Status   Specimen Description IN/OUT CATH URINE  Final   Special Requests   Final    NONE Performed at [facility], [address] [place], [place] [address]    Culture MULTIPLE SPECIES PRESENT, SUGGEST RECOLLECTION (A)  Final   Report Status 09/30/2019 FINAL  Final  MRSA PCR Screening     Status: None   Collection Time: 09/29/19  5:48 AM   Specimen: Nasal Mucosa; Nasopharyngeal  Result Value Ref Range Status   MRSA by PCR NEGATIVE NEGATIVE Final    Comment:        The GeneXpert MRSA Assay (FDA approved for NASAL specimens only), is one component of a comprehensive MRSA colonization surveillance program. It is not intended to diagnose MRSA infection nor to guide or monitor treatment for MRSA infections. Performed at [facility], [address] [place], [place] [address]          Radiology Studies: DG Chest Portable 1 View  Result Date: 09/28/2019 CLINICAL DATA:  Shortness of breath EXAM: PORTABLE CHEST 1 VIEW COMPARISON:  02/26/2017 FINDINGS: Single frontal view of the chest demonstrates a stable cardiac silhouette. There is left lower lobe consolidation with likely trace left pleural effusion. No pneumothorax. No acute bony abnormalities. IMPRESSION: 1. Left lower lobe consolidation consistent with  airspace disease or atelectasis. 2. Trace left pleural effusion. Electronically  Signed   By: Sharlet Salina M.D.   On: 09/28/2019 20:44        Scheduled Meds: . enoxaparin (LOVENOX) injection  40 mg Subcutaneous Q24H  . ipratropium-albuterol  3 mL Nebulization TID   Continuous Infusions: . ceFEPime (MAXIPIME) IV 2 g (09/30/19 1025)  . doxycycline (VIBRAMYCIN) IV 100 mg (09/29/19 2249)     LOS: 2 days    Time spent: 35 minutes.     Alba Cory, MD [facility]   If 7PM-7AM, please contact night-coverage [email or web address]  09/30/2019, 11:19 AM

## 2019-09-30 NOTE — Plan of Care (Signed)
  Problem: Activity: Goal: Ability to tolerate increased activity will improve Outcome: Progressing   Problem: Clinical Measurements: Goal: Ability to maintain a body temperature in the normal range will improve Outcome: Progressing   Problem: Respiratory: Goal: Ability to maintain adequate ventilation will improve Outcome: Progressing Goal: Ability to maintain a clear airway will improve Outcome: Progressing   

## 2019-09-30 NOTE — Progress Notes (Signed)
Pharmacy Antibiotic Note  Willie Turner is a 79 y.o. male admitted on 09/28/2019 with concern for pneumonia.  Pharmacy has been consulted for Cefepime dosing. Also on Doxycycline IV per MD.    Creatinine improved.  Plan:  Cefepime 2 gm IV q12h > q8hrs  Doxy 100 mg IV q12h per MD  Will follow renal function, culture data, clinical progress and antibiotic plans.   Height: 5\' 10"  (177.8 cm) Weight: 78.4 kg (172 lb 13.5 oz) IBW/kg (Calculated) : 73  Temp (24hrs), Avg:98.6 F (37 C), Min:97.2 F (36.2 C), Max:99.1 F (37.3 C)  Recent Labs  Lab 09/28/19 2006 09/28/19 2011 09/29/19 0252 09/30/19 0302  WBC 12.7*  --  7.0 8.2  CREATININE 1.21 1.00 1.07 0.86  LATICACIDVEN 1.9  --   --   --     Estimated Creatinine Clearance: 73.1 mL/min (by C-G formula based on SCr of 0.86 mg/dL).    No Known Allergies  Antimicrobials this admission:  Cefepime 9/9>>  Doxycycline IV 9/9 >>  Dose adjustments this admission:  9/11:  Cefepime q12h > q8hrs for improved renal function  Microbiology results:  9/10 urine: multiple species  9/10 MRSA PCR: negative  9/9 blood x 2: no growth < 12 hrs to date  9/9 COVID: negative  Thank you for allowing pharmacy to be a part of this patient's care.  11/9, Dennie Fetters Phone: [phone] 09/30/2019 12:02 PM

## 2019-09-30 NOTE — Progress Notes (Signed)
PT Cancellation Note  Patient Details Name: Willie Turner MRN: [id number] DOB: [birth year]   Cancelled Treatment:    Reason Eval/Treat Not Completed: PT screened, no needs identified, will sign off Per chart review, pt has advanced dementia and is total A with basic ADLs at baseline. Not appropriate for skilled acute rehabilitation. Recommend dispo, return to SNF level of care.   Berton Mount 09/30/2019, 12:43 PM

## 2019-09-30 NOTE — Consult Note (Signed)
Consultation Note Date: 09/30/2019    Patient Name: Willie Turner  DOB: 1940/09/17  MRN: [id number]  Age / Sex: 79 y.o., male  PCP: Assunta Found, MD Referring Physician: Alba Cory, MD   Reason for Consultation: Establishing goals of care  HPI/Patient Profile: 79 y.o. male  with past medical history of dementia, COPD, HTN, GERD, depression admitted on 09/28/2019 with increased work of breathing and hypoxia. Hospital admission for sepsis and acute respiratory failure secondary to pneumonia. Receiving IV antibiotics. Baseline, patient with dementia, dependent on all ADL's, and nonverbal. Palliative medicine consultation for goals of care.    Clinical Assessment and Goals of Care:  I have reviewed medical records, discussed with care team, and visited with patient at bedside. He is awake, alert but with baseline dementia and nonverbal. He is watching tv. He is occasionally tearful but easily comforted.   Daughter, Cletus Gash at bedside.   I introduced Palliative Medicine as specialized medical care for people living with serious illness. It focuses on providing relief from the symptoms and stress of a serious illness. The goal is to improve quality of life for both the patient and the family.  Explored if Robertlee has a documented POA. Almyra Free shares that primary POA is her step-mother Berline Lopes but she also suffers from dementia and unable to make medical decisions for [place]. Secondary POA is the patient's son, Emeric Novinger who lives in [place]. Libby and her step-brother Jillyn Hidden) are local and involved in her father's care. All three children communicate regularly about Kayla's condition. I did review a partial HCPOA document on Libby's phone. Requested copy for EMR.  We discussed a brief life review of the patient. Suffered from dementia for many years. He has significantly declined in the last year when he  was transitioned from [facility] to [facility]. He is dependent of all ADL's and essentially non-verbal. Lift is necessary for transfers to chair.  Discussed events leading up to hospitalization and course of hospitalization including diagnoses, interventions, plan of care. Discussed high risk for recurrent hospitalization with progressive dementia now possible aspiration. Discussed dysphagia diet and aspiration precautions.   I attempted to elicit values and goals of care important to the daughter. Almyra Free confirms that her father has a DNR/DNI in place. She shares that he has been "unhappy" with quality of life and occasionally will cry as he is today. She does speak of his comfort being important to the children.   Explored whether hospice has been recommended for [facility] at [facility]. Almyra Free shares it has not previously been discussed but this is the first time in awhile he has required hospital admission. She did speak with a provider in ED about hospice.   Introduced and discussed MOST form. Discussed hospice philosophy and additionally support they could provide at SNF, of course if goals are aimed at comfort measures, allowing nature to take course, and keeping her father out of the hospital. We discussed that her father is not a candidate for PT/OT with baseline dementia and dependent  functional status. Almyra Free understands this and is considering hospice services at [facility]. She plans to further discuss with brother/POA Alvan Dame) and step-brother Jillyn Hidden).   Discussed ongoing medical management inpatient, antibiotics and attempting to further wean oxygen.   Questions and concerns were addressed. PMT contact information given and this NP plans to f/u with Almyra Free on Monday morning.     SUMMARY OF RECOMMENDATIONS    GOC discussion with daughter, Cletus Gash at bedside.  Almyra Free confirms patient has documented HCPOA. Primary is wife Berline Lopes) but she suffers from dementia and unable to make medical decisions  for patient. Secondary POA is patient's son Harding Thomure). Son lives in [place]. Libby and step-brother, Jillyn Hidden are local and actively involved in patient's care. All three children communicate regularly and will make decisions together. Requested copy of HCPOA for EMR.  Daughter confirms DNR/DNI code status. Durable DNR in chart.  Introduced and discussed MOST form. Almyra Free will further discuss with brothers and contact PMT provider Monday morning if ready to complete.   Continue current plan of care and medical management. Continue attempts at weaning oxygen.   Continue dysphagia diet. Comfort feeds and aspiration precautions.   Introduced and discussed outpatient palliative versus hospice services at [facility]. Daughter is considering options and will further discuss with brothers. Daughter understands Telvin is not a candidate for rehab with dementia/baseline dependent functional status.  This PMT provider will f/u Monday 9/13.  Code Status/Advance Care Planning:  DNR  Symptom Management:   Per attending  Palliative Prophylaxis:   Aspiration, Delirium Protocol, Frequent Pain Assessment, Oral Care and Turn Reposition  Additional Recommendations (Limitations, Scope, Preferences):  DNR/DNI, NO feeding tube. Medical management  Psycho-social/Spiritual:   Desire for further Chaplaincy support:yes  Additional Recommendations: Caregiving  Support/Resources, Compassionate Wean Education and Education on Hospice  Prognosis:   Poor long-term prognosis with progressive dementia  Discharge Planning: Back to long-term care facility with outpatient palliative or hospice services. Pending further family discussion.      Primary Diagnoses: Present on Admission: . Sepsis due to pneumonia (HCC) . Acute respiratory failure with hypoxia (HCC) . Dementia with behavioral disturbance (HCC) . COPD (chronic obstructive pulmonary disease) (HCC) . Prolonged QT interval   I have reviewed the  medical record, interviewed the patient and family, and examined the patient. The following aspects are pertinent.  Past Medical History:  Diagnosis Date  . Arthritis   . Asthma   . 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 Bronchitis   . COPD (chronic obstructive pulmonary disease) (HCC)   . Dementia (HCC)   . Depression   . GERD (gastroesophageal reflux disease)   . Hypertension    not on meds  . Memory loss   . Right knee DJD   . Shortness of breath    walking distances or climbing stairs   Social History   Socioeconomic History  . Marital status: Married    Spouse name: Not on file  . Number of children: Not on file  . Years of education: Not on file  . Highest education level: Not on file  Occupational History  . Not on file  Tobacco Use  . Smoking status: Former Smoker    Packs/day: 1.00    Years: 25.00    Pack years: 25.00    Types: Cigarettes    Quit date: 01/19/1981    Years since quitting: 38.7  . Smokeless tobacco: Never Used  Vaping Use  . Vaping Use: Never  used  Substance and Sexual Activity  . Alcohol use: Yes    Alcohol/week: 2.0 standard drinks    Types: 2 Shots of liquor per week    Comment: occas   . Drug use: No  . Sexual activity: Not on file  Other Topics Concern  . Not on file  Social History Narrative   1 cup of coffee in the mornings   Social Determinants of Health   Financial Resource Strain:   . Difficulty of Paying Living Expenses: Not on file  Food Insecurity:   . Worried About Programme researcher, broadcasting/film/videounning Out of Food in the Last Year: Not on file  . Ran Out of Food in the Last Year: Not on file  Transportation Needs:   . Lack of Transportation (Medical): Not on file  . Lack of Transportation (Non-Medical): Not on file  Physical Activity:   . Days of Exercise per Week: Not on file  . Minutes of Exercise per Session: Not on file  Stress:   . Feeling of Stress : Not on file  Social Connections:   . Frequency of Communication with  Friends and Family: Not on file  . Frequency of Social Gatherings with Friends and Family: Not on file  . Attends Religious Services: Not on file  . Active Member of Clubs or Organizations: Not on file  . Attends BankerClub or Organization Meetings: Not on file  . Marital Status: Not on file   Family History  Problem Relation Age of Onset  . Heart attack Mother   . Heart attack Father   . Lung cancer Sister   . Lymphoma Brother   . Lung cancer Daughter    Scheduled Meds: . enoxaparin (LOVENOX) injection  40 mg Subcutaneous Q24H  . ipratropium-albuterol  3 mL Nebulization TID   Continuous Infusions: . ceFEPime (MAXIPIME) IV    . doxycycline (VIBRAMYCIN) IV 100 mg (09/30/19 1208)   PRN Meds:.albuterol Medications Prior to Admission:  Prior to Admission medications   Medication Sig Start Date End Date Taking? Authorizing Provider  acetaminophen (TYLENOL) 500 MG tablet Take 500 mg by mouth 3 (three) times daily as needed for mild pain.   Yes [provider]  albuterol (PROVENTIL HFA;VENTOLIN HFA) 108 (90 BASE) MCG/ACT inhaler Inhale 1 puff into the lungs as needed for wheezing.    Yes [provider]  Carboxymethylcellulose Sodium (ARTIFICIAL TEARS OP) Place 2 drops into both eyes in the morning and at bedtime.   Yes [provider]  divalproex (DEPAKOTE SPRINKLE) 125 MG capsule Take 250 mg by mouth every 12 (twelve) hours. 09/29/16  Yes [provider]  escitalopram (LEXAPRO) 10 MG tablet Take 1 tablet (10 mg total) by mouth daily. 03/09/16  Yes Dohmeier, Porfirio Mylararmen, MD  loratadine (CLARITIN) 10 MG tablet Take 10 mg by mouth daily as needed for allergies.   Yes [provider]  potassium chloride SA (K-DUR,KLOR-CON) 20 MEQ tablet Take 1 tablet by mouth daily. 09/29/16  Yes [provider]  QUEtiapine (SEROQUEL) 25 MG tablet Take 25 mg by mouth 2 (two) times daily.   Yes [provider]  traMADol (ULTRAM) 50 MG tablet Take 50 mg by  mouth every 8 (eight) hours as needed for moderate pain.   Yes [provider]   No Known Allergies Review of Systems  Unable to perform ROS: Dementia   Physical Exam Vitals and nursing note reviewed.  Constitutional:      General: He is awake.     Appearance: He is  ill-appearing.  HENT:     Head: Normocephalic and atraumatic.  Pulmonary:     Effort: No tachypnea, accessory muscle usage or respiratory distress.     Comments: 8L [place] Abdominal:     Tenderness: There is no abdominal tenderness.  Skin:    General: Skin is warm and dry.     Coloration: Skin is pale.  Neurological:     Mental Status: He is alert.     Comments: Awake, alert, but nonverbal with baseline dementia.  Psychiatric:     Comments: Tearful    Vital Signs: BP 114/81 (BP Location: Right Arm)   Pulse 92   Temp 98.7 F (37.1 C) (Oral)   Resp 18   Ht 5\' 10"  (1.778 m)   Wt 78.4 kg   SpO2 99%   BMI 24.80 kg/m  Pain Scale: 0-10   Pain Score: 0-No pain   SpO2: SpO2: 99 % O2 Device:SpO2: 99 % O2 Flow Rate: .O2 Flow Rate (L/min): 9 L/min  IO: Intake/output summary:   Intake/Output Summary (Last 24 hours) at 09/30/2019 1642 Last data filed at 09/30/2019 0700 Gross per 24 hour  Intake 250 ml  Output --  Net 250 ml    LBM: Last BM Date: 09/29/19 Baseline Weight: Weight: 78.4 kg Most recent weight: Weight: 78.4 kg     Palliative Assessment/Data: PPS 30%   Flowsheet Rows     Most Recent Value  Intake Tab  Referral Department Hospitalist  Unit at Time of Referral Med/Surg Unit  Palliative Care Primary Diagnosis Neurology  Palliative Care Type New Palliative care  Reason for referral Clarify Goals of Care  Date of Admission 09/29/19  Date first seen by Palliative Care 09/29/19  Clinical Assessment  Palliative Performance Scale Score 30%  Psychosocial & Spiritual Assessment  Palliative Care Outcomes  Patient/Family meeting held? Yes  Who was at the meeting? daughter  Palliative Care  Outcomes Clarified goals of care, Counseled regarding hospice, Provided end of life care assistance, Provided psychosocial or spiritual support, ACP counseling assistance      Time In: 1420 Time Out: 1520 Time Total: 60 Greater than 50%  of this time was spent counseling and coordinating care related to the above assessment and plan.  Signed by:  11/29/19, DNP, FNP-C Palliative Medicine Team  Phone: [phone] Fax: [phone]   Please contact Palliative Medicine Team phone at [phone] for questions and concerns.  For individual provider: See [phone]

## 2019-10-01 LAB — BASIC METABOLIC PANEL
Anion gap: 12 (ref 5–15)
BUN: 14 mg/dL (ref 8–23)
CO2: 23 mmol/L (ref 22–32)
Calcium: 8.9 mg/dL (ref 8.9–10.3)
Chloride: 106 mmol/L (ref 98–111)
Creatinine, Ser: 0.81 mg/dL (ref 0.61–1.24)
GFR calc Af Amer: >60 mL/min (ref >60)
GFR calc non Af Amer: >60 mL/min (ref >60)
Glucose, Bld: 109 mg/dL — ABNORMAL HIGH (ref 70–99)
Potassium: 3.1 mmol/L — ABNORMAL LOW (ref 3.5–5.1)
Sodium: 141 mmol/L (ref 135–145)

## 2019-10-01 LAB — CBC WITH DIFFERENTIAL/PLATELET
Abs Immature Granulocytes: 0.06 10*3/uL (ref 0.00–0.07)
Basophils Absolute: 0 10*3/uL (ref 0.0–0.1)
Basophils Relative: 0 %
Eosinophils Absolute: 0 10*3/uL (ref 0.0–0.5)
Eosinophils Relative: 0 %
HCT: 38.1 % — ABNORMAL LOW (ref 39.0–52.0)
Hemoglobin: 12.1 g/dL — ABNORMAL LOW (ref 13.0–17.0)
Immature Granulocytes: 1 %
Lymphocytes Relative: 12 %
Lymphs Abs: 1.1 10*3/uL (ref 0.7–4.0)
MCH: 28.1 pg (ref 26.0–34.0)
MCHC: 31.8 g/dL (ref 30.0–36.0)
MCV: 88.4 fL (ref 80.0–100.0)
Monocytes Absolute: 0.7 10*3/uL (ref 0.1–1.0)
Monocytes Relative: 8 %
Neutro Abs: 6.7 10*3/uL (ref 1.7–7.7)
Neutrophils Relative %: 79 %
Platelets: 299 10*3/uL (ref 150–400)
RBC: 4.31 MIL/uL (ref 4.22–5.81)
RDW: 13.2 % (ref 11.5–15.5)
WBC: 8.5 10*3/uL (ref 4.0–10.5)
nRBC: 0 % (ref 0.0–0.2)

## 2019-10-01 MED ORDER — SODIUM CHLORIDE 0.9 % IV BOLUS
500.0000 mL | Freq: Once | INTRAVENOUS | Status: AC
  Administered 2019-10-01: 500 mL via INTRAVENOUS

## 2019-10-01 MED ORDER — POTASSIUM CHLORIDE CRYS ER 20 MEQ PO TBCR
20.0000 meq | EXTENDED_RELEASE_TABLET | Freq: Once | ORAL | Status: AC
  Administered 2019-10-01: 20 meq via ORAL
  Filled 2019-10-01: qty 1

## 2019-10-01 MED ORDER — QUETIAPINE FUMARATE 25 MG PO TABS: 25.0000 mg | ORAL_TABLET | Freq: Two times a day (BID) | ORAL | Status: DC

## 2019-10-01 MED ORDER — POTASSIUM CHLORIDE 20 MEQ PO PACK
40.0000 meq | PACK | Freq: Once | ORAL | Status: AC
  Administered 2019-10-01: 40 meq via ORAL
  Filled 2019-10-01 (×2): qty 2

## 2019-10-01 MED ORDER — SODIUM CHLORIDE 0.9 % IV SOLN: INTRAVENOUS | Status: DC

## 2019-10-01 MED ORDER — DIVALPROEX SODIUM 125 MG PO CSDR
250.0000 mg | DELAYED_RELEASE_CAPSULE | Freq: Two times a day (BID) | ORAL | Status: DC
  Administered 2019-10-01 – 2019-10-02 (×3): 250 mg via ORAL
  Filled 2019-10-01 (×3): qty 2

## 2019-10-01 MED ORDER — ESCITALOPRAM OXALATE 10 MG PO TABS: 10.0000 mg | ORAL_TABLET | Freq: Every day | ORAL | Status: DC

## 2019-10-01 NOTE — Progress Notes (Signed)
PROGRESS NOTE    Willie Turner  CWC:[id number] DOB: [birth year] DOA: 09/28/2019 PCP: Assunta Found, MD   Brief Narrative: 79 year old with past medical history significant for dementia, COPD presented from skilled nursing facility with dyspnea.  At baseline patient is only able to open his eyes and eat when he is fed, but otherwise he is not interactive.  Patient developed increased work of breathing and hypoxemia.  He was treated with IV Solu-Medrol and nebulizer and placed on the nonrebreather by EMS.  Patient admitted with acute hypoxic respiratory failure secondary to pneumonia and sepsis secondary to pneumonia.   Assessment & Plan:   Principal Problem:   Sepsis due to pneumonia [facility]) Active Problems:   COPD (chronic obstructive pulmonary disease) (HCC)   Dementia with behavioral disturbance (HCC)   Acute respiratory failure with hypoxia (HCC)   Prolonged QT interval   Community acquired pneumonia of left lower lobe of lung   Palliative care by specialist   Goals of care, counseling/discussion  1-Sepsis secondary to pneumonia: -Patient presented with, tachypnea respiration rate 37, leukocytosis, tachycardia.  Pneumonia.  Chest x-ray showed left lower lobe consolidation consistent with airspace disease. -Continue with IV cefepime and doxycycline -Blood cultures;No growth to date.  - Acute hypoxic respiratory failure secondary to pneumonia: -on admission required 15 L oxygen, down to 5 L today.  Chest x-ray showed left lower lobe consolidation. Speech therapist evaluation, dysphagia 1 diet  COPD: Continue with nebulizer as needed  Dementia: Advanced at baseline, he is nonverbal only open eyes and need assistant with feeding. He is sleepy today.  Resume Depakote.   Prolonged QT: Avoid QT prolongation medication. Mag at 1.8. replaced IV.   Hyperglycemia: Might have been related to dose of IV Solu-Medrol Monitor CBG a.m. labs Resolved.   Hypokalemia; replete orally.    Estimated body mass index is 24.8 kg/m as calculated from the following:   Height as of this encounter: 5\' 10"  (1.778 m).   Weight as of this encounter: 78.4 kg.   DVT prophylaxis: Lovenox Code Status: DNR Family Communication: , step 9/10 Disposition Plan:  Status is: Inpatient  Remains inpatient appropriate because:IV treatments appropriate due to intensity of illness or inability to take PO   Dispo: The patient is from: SNF              Anticipated d/c is to: SNF              Anticipated d/c date is: 3 days              Patient currently is not medically stable to d/c.        Consultants:   None  Procedures:   None  Antimicrobials:  Cefepime and doxycycline 9/9  Subjective: He is more sleepy this am.  Open eyes.   Objective: Vitals:   09/30/19 2008 09/30/19 2300 10/01/19 0338 10/01/19 0729  BP: (!) 156/99 (!) 159/81 121/80 131/75  Pulse: 99 97 (!) 107 84  Resp: 19 18 19 18   Temp: 99.1 F (37.3 C) 98.8 F (37.1 C) 99 F (37.2 C) 98.7 F (37.1 C)  TempSrc: Oral Oral Oral Oral  SpO2: 100% 99% 100% 99%  Weight:      Height:        Intake/Output Summary (Last 24 hours) at 10/01/2019 0806 Last data filed at 09/30/2019 1838 Gross per 24 hour  Intake 80 ml  Output --  Net 80 ml   Filed Weights   09/28/19 2100  Weight: 78.4  kg    Examination:  General exam: NAD Respiratory system: Bilateral ronchus Cardiovascular system: S 1, S 2 RRR Gastrointestinal system: BS present, soft, nt Central nervous system: Alert Extremities: No edema   Data Reviewed: I have personally reviewed following labs and imaging studies  CBC: Recent Labs  Lab 09/28/19 2006 09/28/19 2011 09/29/19 0252 09/30/19 0302 10/01/19 0126  WBC 12.7*  --  7.0 8.2 8.5  NEUTROABS 10.0*  --  5.7 6.1 6.7  HGB 13.6 14.6  15.0 12.5* 10.5* 12.1*  HCT 43.4 43.0  44.0 40.4 34.2* 38.1*  MCV 88.6  --  90.6 88.6 88.4  PLT 405*  --  289 254 299   Basic Metabolic  Panel: Recent Labs  Lab 09/28/19 2006 09/28/19 2011 09/29/19 0252 09/30/19 0302 10/01/19 0126  NA 138 138  139 138 139 141  K 3.9 3.8  3.8 3.7 4.2 3.1*  CL 103 105 104 107 106  CO2 21*  --  22 22 23   GLUCOSE 187* 189* 203* 99 109*  BUN 20 23 17 19 14   CREATININE 1.21 1.00 1.07 0.86 0.81  CALCIUM 9.1  --  8.5* 8.6* 8.9  MG  --   --   --  1.8  --    GFR: Estimated Creatinine Clearance: 77.6 mL/min (by C-G formula based on SCr of 0.81 mg/dL). Liver Function Tests: Recent Labs  Lab 09/28/19 2006  AST 15  ALT 10  ALKPHOS 45  BILITOT 1.0  PROT 7.2  ALBUMIN 3.0*   No results for input(s): LIPASE, AMYLASE in the last 168 hours. No results for input(s): AMMONIA in the last 168 hours. Coagulation Profile: Recent Labs  Lab 09/28/19 2006  INR 1.1   Cardiac Enzymes: No results for input(s): CKTOTAL, CKMB, CKMBINDEX, TROPONINI in the last 168 hours. BNP (last 3 results) No results for input(s): PROBNP in the last 8760 hours. HbA1C: No results for input(s): HGBA1C in the last 72 hours. CBG: No results for input(s): GLUCAP in the last 168 hours. Lipid Profile: No results for input(s): CHOL, HDL, LDLCALC, TRIG, CHOLHDL, LDLDIRECT in the last 72 hours. Thyroid Function Tests: No results for input(s): TSH, T4TOTAL, FREET4, T3FREE, THYROIDAB in the last 72 hours. Anemia Panel: No results for input(s): VITAMINB12, FOLATE, FERRITIN, TIBC, IRON, RETICCTPCT in the last 72 hours. Sepsis Labs: Recent Labs  Lab 09/28/19 2006 09/29/19 0252 09/30/19 0302  PROCALCITON  --  3.85 3.91  LATICACIDVEN 1.9  --   --     Recent Results (from the past 240 hour(s))  SARS Coronavirus 2 by RT PCR (hospital order, performed in [facility] hospital lab) Nasopharyngeal Nasopharyngeal Swab     Status: None   Collection Time: 09/28/19  8:03 PM   Specimen: Nasopharyngeal Swab  Result Value Ref Range Status   SARS Coronavirus 2 NEGATIVE NEGATIVE Final    Comment: (NOTE) SARS-CoV-2 target  nucleic acids are NOT DETECTED.  The SARS-CoV-2 RNA is generally detectable in upper and lower respiratory specimens during the acute phase of infection. The lowest concentration of SARS-CoV-2 viral copies this assay can detect is 250 copies / mL. A negative result does not preclude SARS-CoV-2 infection and should not be used as the sole basis for treatment or other patient management decisions.  A negative result may occur with improper specimen collection / handling, submission of specimen other than nasopharyngeal swab, presence of viral mutation(s) within the areas targeted by this assay, and inadequate number of viral copies (<250 copies / mL). A negative  result must be combined with clinical observations, patient history, and epidemiological information.  Fact Sheet for Patients:   BoilerBrush.com.cy  Fact Sheet for Healthcare Providers: https://pope.com/  This test is not yet approved or  cleared by the [place] FDA and has been authorized for detection and/or diagnosis of SARS-CoV-2 by FDA under an Emergency Use Authorization (EUA).  This EUA will remain in effect (meaning this test can be used) for the duration of the COVID-19 declaration under Section 564(b)(1) of the Act, 21 U.S.C. section 360bbb-3(b)(1), unless the authorization is terminated or revoked sooner.  Performed at [facility], [address], [place], [place] [address]   Blood Culture (routine x 2)     Status: None (Preliminary result)   Collection Time: 09/28/19  8:07 PM   Specimen: BLOOD  Result Value Ref Range Status   Specimen Description BLOOD RIGHT ANTECUBITAL  Final   Special Requests   Final    BOTTLES DRAWN AEROBIC AND ANAEROBIC Blood Culture results may not be optimal due to an excessive volume of blood received in culture bottles   Culture   Final    NO GROWTH 2 DAYS Performed at [facility], [address] [place], [place]  [address]    Report Status PENDING  Incomplete  Blood Culture (routine x 2)     Status: None (Preliminary result)   Collection Time: 09/28/19  8:07 PM   Specimen: BLOOD LEFT ARM  Result Value Ref Range Status   Specimen Description BLOOD LEFT ARM  Final   Special Requests   Final    BOTTLES DRAWN AEROBIC ONLY Blood Culture results may not be optimal due to an inadequate volume of blood received in culture bottles   Culture   Final    NO GROWTH 2 DAYS Performed at [facility], [address] [place], [place] [address]    Report Status PENDING  Incomplete  Urine culture     Status: Abnormal   Collection Time: 09/29/19  4:19 AM   Specimen: In/Out Cath Urine  Result Value Ref Range Status   Specimen Description IN/OUT CATH URINE  Final   Special Requests   Final    NONE Performed at [facility], [address] [place], [place] [address]    Culture MULTIPLE SPECIES PRESENT, SUGGEST RECOLLECTION (A)  Final   Report Status 09/30/2019 FINAL  Final  MRSA PCR Screening     Status: None   Collection Time: 09/29/19  5:48 AM   Specimen: Nasal Mucosa; Nasopharyngeal  Result Value Ref Range Status   MRSA by PCR NEGATIVE NEGATIVE Final    Comment:        The GeneXpert MRSA Assay (FDA approved for NASAL specimens only), is one component of a comprehensive MRSA colonization surveillance program. It is not intended to diagnose MRSA infection nor to guide or monitor treatment for MRSA infections. Performed at [facility], [address] [place], [place] [address]          Radiology Studies: No results found.      Scheduled Meds: . divalproex  250 mg Oral Q12H  . enoxaparin (LOVENOX) injection  40 mg Subcutaneous Q24H  . ipratropium-albuterol  3 mL Nebulization TID  . potassium chloride  40 mEq Oral Once   Continuous Infusions: . ceFEPime (MAXIPIME) IV Stopped (10/01/19 0235)  . doxycycline (VIBRAMYCIN) IV Stopped (10/01/19 0105)     LOS: 3 days     Time spent: 35  minutes.     Alba CoryBelkys A Leland Staszewski, MD [facility]   If 7PM-7AM, please contact night-coverage [email or web address]  10/01/2019, 8:06 AM

## 2019-10-01 NOTE — Plan of Care (Signed)
  Problem: Activity: Goal: Ability to tolerate increased activity will improve Outcome: Progressing   Problem: Clinical Measurements: Goal: Ability to maintain a body temperature in the normal range will improve Outcome: Progressing   Problem: Respiratory: Goal: Ability to maintain adequate ventilation will improve Outcome: Progressing Goal: Ability to maintain a clear airway will improve Outcome: Progressing   

## 2019-10-02 LAB — BLOOD CULTURE ID PANEL (REFLEXED) - BCID2

## 2019-10-02 LAB — BASIC METABOLIC PANEL
Anion gap: 5 (ref 5–15)
BUN: 16 mg/dL (ref 8–23)
CO2: 28 mmol/L (ref 22–32)
Calcium: 8.4 mg/dL — ABNORMAL LOW (ref 8.9–10.3)
Chloride: 111 mmol/L (ref 98–111)
Creatinine, Ser: 0.79 mg/dL (ref 0.61–1.24)
GFR calc Af Amer: >60 mL/min (ref >60)
GFR calc non Af Amer: >60 mL/min (ref >60)
Glucose, Bld: 108 mg/dL — ABNORMAL HIGH (ref 70–99)
Potassium: 3.6 mmol/L (ref 3.5–5.1)
Sodium: 144 mmol/L (ref 135–145)

## 2019-10-02 LAB — CBC WITH DIFFERENTIAL/PLATELET
Abs Immature Granulocytes: 0.05 10*3/uL (ref 0.00–0.07)
Basophils Absolute: 0 10*3/uL (ref 0.0–0.1)
Basophils Relative: 0 %
Eosinophils Absolute: 0 10*3/uL (ref 0.0–0.5)
Eosinophils Relative: 1 %
HCT: 30.7 % — ABNORMAL LOW (ref 39.0–52.0)
Hemoglobin: 9.5 g/dL — ABNORMAL LOW (ref 13.0–17.0)
Immature Granulocytes: 1 %
Lymphocytes Relative: 16 %
Lymphs Abs: 1.2 10*3/uL (ref 0.7–4.0)
MCH: 27.5 pg (ref 26.0–34.0)
MCHC: 30.9 g/dL (ref 30.0–36.0)
MCV: 89 fL (ref 80.0–100.0)
Monocytes Absolute: 0.6 10*3/uL (ref 0.1–1.0)
Monocytes Relative: 7 %
Neutro Abs: 6 10*3/uL (ref 1.7–7.7)
Neutrophils Relative %: 75 %
Platelets: 249 10*3/uL (ref 150–400)
RBC: 3.45 MIL/uL — ABNORMAL LOW (ref 4.22–5.81)
RDW: 13.7 % (ref 11.5–15.5)
WBC: 7.9 10*3/uL (ref 4.0–10.5)
nRBC: 0 % (ref 0.0–0.2)

## 2019-10-02 LAB — SARS CORONAVIRUS 2 BY RT PCR (HOSPITAL ORDER, PERFORMED IN ~~LOC~~ HOSPITAL LAB): SARS Coronavirus 2: NEGATIVE

## 2019-10-02 MED ORDER — AMOXICILLIN-POT CLAVULANATE 875-125 MG PO TABS: 1.0000 | ORAL_TABLET | Freq: Two times a day (BID) | ORAL | 0 refills | Status: DC

## 2019-10-02 MED ORDER — AMOXICILLIN-POT CLAVULANATE 875-125 MG PO TABS: 1.0000 | ORAL_TABLET | Freq: Two times a day (BID) | ORAL | 0 refills | Status: AC

## 2019-10-02 MED ORDER — IPRATROPIUM-ALBUTEROL 0.5-2.5 (3) MG/3ML IN SOLN: 3.0000 mL | Freq: Three times a day (TID) | RESPIRATORY_TRACT | 0 refills | Status: AC

## 2019-10-02 NOTE — Progress Notes (Signed)
Daily Progress Note   Patient Name: Willie Turner       Date: 10/02/19 DOB: 09-17-1940  Age: 79 y.o. MRN#: [id number] Attending Physician: Alba Cory, MD Primary Care Physician: Assunta Found, MD Admit Date: 09/28/2019  Reason for Consultation/Follow-up: Establishing goals of care  Subjective: Patient with baseline dementia and non-verbal. Awake, alert. Appears comfortable without s/s of distress.   GOC:  F/u with patient's daughter, Cletus Gash. Almyra Free got a chance to speak with her brother Adcock) and step-brother Jillyn Hidden) about goals of care this weekend.   Perfecto is documented POA but lives in [place]. Call placed to [facility]. Reviewed course of hospitalization including diagnoses, interventions, plan of care, and dementia disease trajectory. Aryeh and [employer] agree with DNR/DNI and NO Feeding tube. They both agree that goals for their father are aimed at comfort and symptom management, knowing he is not happy with his quality of life at SNF.  Kwamaine gives this NP verbal permission to complete MOST form with his sister, Almyra Free. Jonny Ruiz and Almyra Free are both ready to start hospice services at [facility], understanding hospice philosophy. They report Jillyn Hidden is also agreeable.   Completed MOST form. Decisions include: DNR/DNI, comfort focused pathway, IVF/ABX for time trial if indicated, and NO feeding tube. Vynca electronic MOST form completed. Copies made for [facility].  Answered questions regarding plan of care and hospices services. Plan is for discharge back to long-term care facility today with hospice. Updated Dr. Sunnie Nielsen, SW, and RN. Daughter has PMT contact information.    Length of Stay: 4  Current Medications: Scheduled Meds:  . divalproex  250 mg Oral Q12H  . enoxaparin (LOVENOX) injection  40 mg  Subcutaneous Q24H  . ipratropium-albuterol  3 mL Nebulization TID    Continuous Infusions: . sodium chloride 75 mL/hr at 10/02/19 0353  . ceFEPime (MAXIPIME) IV 2 g (10/02/19 0848)  . doxycycline (VIBRAMYCIN) IV 100 mg (10/02/19 1117)    PRN Meds: albuterol  Physical Exam Vitals and nursing note reviewed.            Vital Signs: BP (!) 142/95 (BP Location: Right Arm)   Pulse 73   Temp 98.4 F (36.9 C) (Oral)   Resp 18   Ht 5\' 10"  (1.778 m)   Wt 78.4 kg   SpO2 98%   BMI 24.80 kg/m  SpO2: SpO2: 98 % O2 Device: O2 Device: Nasal Cannula O2 Flow Rate: O2 Flow Rate (L/min): 2 L/min  Intake/output summary:   Intake/Output Summary (Last 24 hours) at 10/02/2019 1448 Last data filed at 10/02/2019 1256 Gross per 24 hour  Intake 120 ml  Output 1300 ml  Net -1180 ml   LBM: Last BM Date: 09/29/19 Baseline Weight: Weight: 78.4 kg Most recent weight: Weight: 78.4 kg       Palliative Assessment/Data: PPS 30%    Flowsheet Rows     Most Recent Value  Intake Tab  Referral Department Hospitalist  Unit at Time of Referral Med/Surg Unit  Palliative Care Primary Diagnosis Neurology  Palliative Care Type New Palliative care  Reason for referral Clarify Goals of Care  Date of Admission 09/29/19  Date first seen by Palliative Care 09/29/19  Clinical Assessment  Palliative Performance Scale Score 30%  Psychosocial & Spiritual Assessment  Palliative Care Outcomes  Patient/Family meeting held? Yes  Who was at the meeting? daughter  Palliative Care Outcomes Clarified goals of care, Counseled regarding hospice, Provided end of life care assistance, Provided psychosocial or spiritual support, ACP counseling assistance      Patient Active Problem List   Diagnosis Date Noted  . Community acquired pneumonia of left lower lobe of lung   . Palliative care by specialist   . Goals of care, counseling/discussion   . Sepsis due to pneumonia (HCC) 09/28/2019  . Acute respiratory failure  with hypoxia (HCC) 09/28/2019  . Prolonged QT interval 09/28/2019  . OA (osteoarthritis) of knee 06/27/2014  . Abnormal MRI of head 06/19/2014  . 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 Psychomotor agitation 05/22/2014  . Dementia with behavioral disturbance (HCC) 05/22/2014  . Alcohol dependence (HCC) 09/06/2012  . COPD (chronic obstructive pulmonary disease) (HCC)   . Shortness of breath   . Arthritis   . Hypertension   . Asthma   . Bronchitis   . Right knee DJD     Palliative Care Assessment & Plan   Patient Profile: 79 y.o. male  with past medical history of dementia, COPD, HTN, GERD, depression admitted on 09/28/2019 with increased work of breathing and hypoxia. Hospital admission for sepsis and acute respiratory failure secondary to pneumonia. Receiving IV antibiotics. Baseline, patient with dementia, dependent on all ADL's, and nonverbal. Palliative medicine consultation for goals of care.    Assessment: Sepsis Pneumonia Acute hypoxic respiratory failure Dementia Aspiration risk  Recommendations/Plan:  F/u GOC with patient's daughter Almyra Free) and son/POA Jonny Ruiz) on speaker phone. POA gives permission to complete MOST form with his sister, Almyra Free.   MOST form decisions: DNR/DNI, comfort measures and initiation of hospice services on discharge, IVF/ABX for time trial, NO feeding tube. Electronic Vynca MOST completed.   TOC team notified of plan to start hospice services at LTC facility on discharge. Plan is for d/c today.    Code Status: DNR/DNI   Code Status Orders  (From admission, onward)         Start     Ordered   09/28/19 2251  Do not attempt resuscitation (DNR)  Continuous       Question Answer Comment  In the event of cardiac or respiratory ARREST Do not call a "code blue"   In the event of cardiac or respiratory ARREST Do not perform Intubation, CPR, defibrillation or ACLS   In the event of cardiac or respiratory ARREST Use  medication by any route, position, wound care, and other  measures to relive pain and suffering. May use oxygen, suction and manual treatment of airway obstruction as needed for comfort.      09/28/19 2255        Code Status History    Date Active Date Inactive Code Status Order ID Comments User Context   09/28/2019 2004 09/28/2019 2255 DNR [id number]  Loree Fee, MD ED   07/16/2016 1553 07/18/2016 1312 Full Code [id number]  Marily Memos, MD ED   05/29/2015 2222 05/30/2015 2022 Full Code [id number]  Samuel Jester, DO ED   06/27/2014 1604 06/30/2014 1422 Full Code [id number]  Ollen Gross, MD Inpatient   09/05/2012 1326 09/06/2012 1423 Full Code [id number]  Shepperson, Clayborne Artist, PA-C Inpatient   Advance Care Planning Activity       Prognosis:  Poor long-term prognosis with progressive dementia  Discharge Planning:  Back to long-term care facility with initiation of hospice services.  Care plan was discussed with RN, SW, Dr. Sunnie Nielsen, daughter, son  Thank you for allowing the Palliative Medicine Team to assist in the care of this patient.   Total Time 40 Prolonged Time Billed no       Greater than 50%  of this time was spent counseling and coordinating care related to the above assessment and plan.  Vennie Homans, DNP, FNP-C Palliative Medicine Team  Phone: [phone] Fax: [phone]  Please contact Palliative Medicine Team phone at [phone] for questions and concerns.

## 2019-10-02 NOTE — Progress Notes (Signed)
  Speech Language Pathology Treatment: Dysphagia  Patient Details Name: Willie Turner MRN: [id number] DOB: 1940/08/13 Today's Date: 10/02/2019 Time: 9643-8381 SLP Time Calculation (min) (ACUTE ONLY): 10 min  Assessment / Plan / Recommendation Clinical Impression  F/u after Friday's swallow assessment.  Pt sleeping but easily arousable.  Repositioned in bed to optimize safe eating.  New precautions sign posted over [facility].  Pt alerted to name. He accepted sips of water from a straw and bites of applesauce with adequate oral attention and manipulation, palpable swallow response, and no s/s of aspiration despite successive sips of water.  Pt responded "hi" clearly and with good volume upon being greeted.  No further verbalizations could be elicited.  Pt is safe to continue current diet.  Despite appearing to be asleep, he is easily arousable and can eat safely with good airway protection.  He requires careful and attentive hand-feeding.  No further SLP f/u is needed.  Our service will sign off.    HPI HPI: 79 y.o., resident of [facility], with medical history most notable for dementia and COPD, now presenting from his SNF for evaluation of breathing difficulty. He is fed by staff at facility. Dx sepsis secondary to pna; acute hypoxic resp failure.       SLP Plan  All goals met       Recommendations  Diet recommendations: Dysphagia 1 (puree);Thin liquid Liquids provided via: Straw Medication Administration: Crushed with puree Supervision: Trained caregiver to feed patient;Full supervision/cueing for compensatory strategies Compensations: Minimize environmental distractions Postural Changes and/or Swallow Maneuvers: Seated upright 90 degrees                Oral Care Recommendations: Oral care BID Follow up Recommendations: Skilled Nursing facility SLP Visit Diagnosis: Dysphagia, unspecified (R13.10) Plan: All goals met       GO              Willie Turner, Willie Turner Office number [phone] Pager [phone]   Willie Turner 10/02/2019, 10:33 AM

## 2019-10-02 NOTE — Progress Notes (Signed)
Patient discharged to [facility] with [facility]. Report to facility by previous nurse. D/c paper work placed in pocket and given to [employer]. Assessments remained unchanged prior to discharge.

## 2019-10-02 NOTE — TOC Transition Note (Signed)
Transition of Care [facility]) - CM/SW Discharge Note   Patient Details  Name: [name] MRN: [id number] Date of Birth: November 26, 1940  Transition of Care [facility]) CM/SW Contact:  Baldemar Lenis, LCSW Phone Number: 10/02/2019, 4:34 PM   Clinical Narrative:   CSW alerted by MD and PMT NP that patient's family would like the patient moved back to [facility] with hospice in place. CSW contacted [facility], and they prefer to use [facility] for hospice services. CSW provided referral to [facility] and confirmed return to [facility] today.  Nurse to call report to [phone].    Final next level of care: Skilled Nursing Facility Barriers to Discharge: Barriers Resolved   Patient Goals and CMS Choice Patient states their goals for this hospitalization and ongoing recovery are:: patient unable to participate in goal setting CMS Medicare.gov Compare Post Acute Care list provided to:: Patient Represenative (must comment) Choice offered to / list presented to : Adult Children  Discharge Placement              Patient chooses bed at: [facility] Patient to be transferred to facility by: PTAR Name of family member notified: Children Patient and family notified of of transfer: 10/02/19  Discharge Plan and Services                                     Social Determinants of Health (SDOH) Interventions     Readmission Risk Interventions No flowsheet data found.

## 2019-10-02 NOTE — Discharge Summary (Addendum)
Physician Discharge Summary  Willie MayhewJohn W Turner ZOX:[id number] DOB: 1940/08/24 DOA: 09/28/2019  PCP: Assunta FoundGolding, Hollis, MD  Admit date: 09/28/2019 Discharge date: 10/02/2019  Admitted From: SNF Disposition:  SNF  Recommendations for Outpatient Follow-up:  1. Follow up with PCP in 1-2 weeks 2. Follow Blood culture  3. SNF with hospice care.    Discharge Condition: Stable CODE STATUS: DNR Diet recommendation: Dysphagia 1 diet  Brief/Interim Summary: 79 year old with past medical history significant for dementia, COPD presented from skilled nursing facility with dyspnea.  At baseline patient is only able to open his eyes and eat when he is fed, but otherwise he is not interactive.  Patient developed increased work of breathing and hypoxemia.  He was treated with IV Solu-Medrol and nebulizer and placed on the nonrebreather by EMS.  Patient admitted with acute hypoxic respiratory failure secondary to pneumonia and sepsis secondary to pneumonia.  1-Sepsis secondary to pneumonia: -Patient presented with, tachypnea respiration rate 37, leukocytosis, tachycardia.  Pneumonia.  Chest x-ray showed left lower lobe consolidation consistent with airspace disease. -Received 5 days of  IV cefepime and doxycycline. Discharge on 2 days of Augmentin.  -Blood cultures; I grew staph coagulase negative. Likely a contaminate. Repeated blood culture today will need to be follow up -BP normalized.   Acute hypoxic respiratory failure secondary to pneumonia: -on admission required 15 L oxygen, down to 5 L today.  Chest x-ray showed left lower lobe consolidation. Speech therapist evaluation, dysphagia 1 diet  COPD: Continue with nebulizer as needed  Dementia: Advanced at baseline, he is nonverbal only open eyes and need assistant with feeding. Open eyes.  Resume Depakote.   Prolonged QT: Avoid QT prolongation medication. Mag at 1.8. replaced IV.  Holding Seroquel.   Hyperglycemia: Might have been related to  dose of IV Solu-Medrol Monitor CBG a.m. labs Resolved.   Hypokalemia; replete orally. resume potasium supplement at discharge.  Anemia; hemodilution.   Discharge Diagnoses:  Principal Problem:   Sepsis due to pneumonia [facility](HCC) Active Problems:   COPD (chronic obstructive pulmonary disease) (HCC)   Dementia with behavioral disturbance (HCC)   Acute respiratory failure with hypoxia (HCC)   Prolonged QT interval   Community acquired pneumonia of left lower lobe of lung   Palliative care by specialist   Goals of care, counseling/discussion    Discharge Instructions  Discharge Instructions    Diet - low sodium heart healthy   Complete by: As directed    Increase activity slowly   Complete by: As directed      Allergies as of 10/02/2019   No Known Allergies     Medication List    STOP taking these medications   loratadine 10 MG tablet Commonly known as: CLARITIN   QUEtiapine 25 MG tablet Commonly known as: SEROQUEL   traMADol 50 MG tablet Commonly known as: ULTRAM     TAKE these medications   acetaminophen 500 MG tablet Commonly known as: TYLENOL Take 500 mg by mouth 3 (three) times daily as needed for mild pain.   albuterol 108 (90 Base) MCG/ACT inhaler Commonly known as: VENTOLIN HFA Inhale 1 puff into the lungs as needed for wheezing.   amoxicillin-clavulanate 875-125 MG tablet Commonly known as: Augmentin Take 1 tablet by mouth 2 (two) times daily for 2 days.   ARTIFICIAL TEARS OP Place 2 drops into both eyes in the morning and at bedtime.   divalproex 125 MG capsule Commonly known as: DEPAKOTE SPRINKLE Take 250 mg by mouth every 12 (twelve) hours.  escitalopram 10 MG tablet Commonly known as: LEXAPRO Take 1 tablet (10 mg total) by mouth daily.   ipratropium-albuterol 0.5-2.5 (3) MG/3ML Soln Commonly known as: DUONEB Take 3 mLs by nebulization 3 (three) times daily.   potassium chloride SA 20 MEQ tablet Commonly known as: KLOR-CON Take 1  tablet by mouth daily.       No Known Allergies  Consultations: none  Procedures/Studies: DG Chest Portable 1 View  Result Date: 09/28/2019 CLINICAL DATA:  Shortness of breath EXAM: PORTABLE CHEST 1 VIEW COMPARISON:  02/26/2017 FINDINGS: Single frontal view of the chest demonstrates a stable cardiac silhouette. There is left lower lobe consolidation with likely trace left pleural effusion. No pneumothorax. No acute bony abnormalities. IMPRESSION: 1. Left lower lobe consolidation consistent with airspace disease or atelectasis. 2. Trace left pleural effusion. Electronically Signed   By: Sharlet Salina M.D.   On: 09/28/2019 20:44     Subjective: Open eyes. He ate 80 % lunch   Discharge Exam: Vitals:   10/02/19 1122 10/02/19 1434  BP: (!) 142/95   Pulse: 88 73  Resp: 18 18  Temp: 98.4 F (36.9 C)   SpO2: 98%      General: Pt is alert, awake, not in acute distress, Cardiovascular: RRR, S1/S2 +, no rubs, no gallops Respiratory: CTA bilaterally, no wheezing, no rhonchi Abdominal: Soft, NT, ND, bowel sounds + Extremities: no edema, no cyanosis    The results of significant diagnostics from this hospitalization (including imaging, microbiology, ancillary and laboratory) are listed below for reference.     Microbiology: Recent Results (from the past 240 hour(s))  SARS Coronavirus 2 by RT PCR (hospital order, performed in [facility] hospital lab) Nasopharyngeal Nasopharyngeal Swab     Status: None   Collection Time: 09/28/19  8:03 PM   Specimen: Nasopharyngeal Swab  Result Value Ref Range Status   SARS Coronavirus 2 NEGATIVE NEGATIVE Final    Comment: (NOTE) SARS-CoV-2 target nucleic acids are NOT DETECTED.  The SARS-CoV-2 RNA is generally detectable in upper and lower respiratory specimens during the acute phase of infection. The lowest concentration of SARS-CoV-2 viral copies this assay can detect is 250 copies / mL. A negative result does not preclude SARS-CoV-2  infection and should not be used as the sole basis for treatment or other patient management decisions.  A negative result may occur with improper specimen collection / handling, submission of specimen other than nasopharyngeal swab, presence of viral mutation(s) within the areas targeted by this assay, and inadequate number of viral copies (<250 copies / mL). A negative result must be combined with clinical observations, patient history, and epidemiological information.  Fact Sheet for Patients:   BoilerBrush.com.cy  Fact Sheet for Healthcare Providers: https://pope.com/  This test is not yet approved or  cleared by the [place] FDA and has been authorized for detection and/or diagnosis of SARS-CoV-2 by FDA under an Emergency Use Authorization (EUA).  This EUA will remain in effect (meaning this test can be used) for the duration of the COVID-19 declaration under Section 564(b)(1) of the Act, 21 U.S.C. section 360bbb-3(b)(1), unless the authorization is terminated or revoked sooner.  Performed at [facility], [address] [place], [place] [address]   Blood Culture (routine x 2)     Status: None (Preliminary result)   Collection Time: 09/28/19  8:07 PM   Specimen: BLOOD  Result Value Ref Range Status   Specimen Description BLOOD RIGHT ANTECUBITAL  Final   Special Requests  Final    BOTTLES DRAWN AEROBIC AND ANAEROBIC Blood Culture results may not be optimal due to an excessive volume of blood received in culture bottles   Culture  Setup Time   Final    GRAM POSITIVE COCCI IN CLUSTERS AEROBIC BOTTLE ONLY Organism ID to follow CRITICAL RESULT CALLED TO, READ BACK BY AND VERIFIED WITH: Lieutenant Diego [id number] 10/02/2019 Girtha Hake Performed at [facility], [address] [place], [place] [address]    Culture GRAM POSITIVE COCCI  Final   Report Status PENDING  Incomplete  Blood Culture (routine x 2)     Status:  None (Preliminary result)   Collection Time: 09/28/19  8:07 PM   Specimen: BLOOD LEFT ARM  Result Value Ref Range Status   Specimen Description BLOOD LEFT ARM  Final   Special Requests   Final    BOTTLES DRAWN AEROBIC ONLY Blood Culture results may not be optimal due to an inadequate volume of blood received in culture bottles   Culture   Final    NO GROWTH 3 DAYS Performed at [facility], [address], [place], [place] [address]    Report Status PENDING  Incomplete  Blood Culture ID Panel (Reflexed)     Status: Abnormal   Collection Time: 09/28/19  8:07 PM  Result Value Ref Range Status   Enterococcus faecalis NOT DETECTED NOT DETECTED Final   Enterococcus Faecium NOT DETECTED NOT DETECTED Final   Listeria monocytogenes NOT DETECTED NOT DETECTED Final   Staphylococcus species DETECTED (A) NOT DETECTED Final    Comment: CRITICAL RESULT CALLED TO, READ BACK BY AND VERIFIED WITH: G. ABBOTT,PHARMD 0550 10/02/2019 T. TYSOR    Staphylococcus aureus (BCID) NOT DETECTED NOT DETECTED Final   Staphylococcus epidermidis NOT DETECTED NOT DETECTED Final   Staphylococcus lugdunensis NOT DETECTED NOT DETECTED Final   Streptococcus species NOT DETECTED NOT DETECTED Final   Streptococcus agalactiae NOT DETECTED NOT DETECTED Final   Streptococcus pneumoniae NOT DETECTED NOT DETECTED Final   Streptococcus pyogenes NOT DETECTED NOT DETECTED Final   A.calcoaceticus-baumannii NOT DETECTED NOT DETECTED Final   Bacteroides fragilis NOT DETECTED NOT DETECTED Final   Enterobacterales NOT DETECTED NOT DETECTED Final   Enterobacter cloacae complex NOT DETECTED NOT DETECTED Final   Escherichia coli NOT DETECTED NOT DETECTED Final   Klebsiella aerogenes NOT DETECTED NOT DETECTED Final   Klebsiella oxytoca NOT DETECTED NOT DETECTED Final   Klebsiella pneumoniae NOT DETECTED NOT DETECTED Final   Proteus species NOT DETECTED NOT DETECTED Final   Salmonella species NOT DETECTED NOT DETECTED Final    Serratia marcescens NOT DETECTED NOT DETECTED Final   Haemophilus influenzae NOT DETECTED NOT DETECTED Final   Neisseria meningitidis NOT DETECTED NOT DETECTED Final   Pseudomonas aeruginosa NOT DETECTED NOT DETECTED Final   Stenotrophomonas maltophilia NOT DETECTED NOT DETECTED Final   Candida albicans NOT DETECTED NOT DETECTED Final   Candida auris NOT DETECTED NOT DETECTED Final   Candida glabrata NOT DETECTED NOT DETECTED Final   Candida krusei NOT DETECTED NOT DETECTED Final   Candida parapsilosis NOT DETECTED NOT DETECTED Final   Candida tropicalis NOT DETECTED NOT DETECTED Final   Cryptococcus neoformans/gattii NOT DETECTED NOT DETECTED Final    Comment: Performed at [facility], [address] [place], [place] [address]  Urine culture     Status: Abnormal   Collection Time: 09/29/19  4:19 AM   Specimen: In/Out Cath Urine  Result Value Ref Range Status   Specimen  Description IN/OUT CATH URINE  Final   Special Requests   Final    NONE Performed at [facility], [address], [place], [place] [address]    Culture MULTIPLE SPECIES PRESENT, SUGGEST RECOLLECTION (A)  Final   Report Status 09/30/2019 FINAL  Final  MRSA PCR Screening     Status: None   Collection Time: 09/29/19  5:48 AM   Specimen: Nasal Mucosa; Nasopharyngeal  Result Value Ref Range Status   MRSA by PCR NEGATIVE NEGATIVE Final    Comment:        The GeneXpert MRSA Assay (FDA approved for NASAL specimens only), is one component of a comprehensive MRSA colonization surveillance program. It is not intended to diagnose MRSA infection nor to guide or monitor treatment for MRSA infections. Performed at [facility], [address] [place], [place] [address]      Labs: BNP (last 3 results) Recent Labs    09/28/19 2003  BNP 116.0*   Basic Metabolic Panel: Recent Labs  Lab 09/28/19 2006 09/28/19 2006 09/28/19 2011 09/29/19 0252 09/30/19 0302 10/01/19 0126 10/02/19 0317  NA  138   < > 138  139 138 139 141 144  K 3.9   < > 3.8  3.8 3.7 4.2 3.1* 3.6  CL 103   < > 105 104 107 106 111  CO2 21*  --   --  22 22 23 28   GLUCOSE 187*   < > 189* 203* 99 109* 108*  BUN 20   < > 23 17 19 14 16   CREATININE 1.21   < > 1.00 1.07 0.86 0.81 0.79  CALCIUM 9.1  --   --  8.5* 8.6* 8.9 8.4*  MG  --   --   --   --  1.8  --   --    < > = values in this interval not displayed.   Liver Function Tests: Recent Labs  Lab 09/28/19 2006  AST 15  ALT 10  ALKPHOS 45  BILITOT 1.0  PROT 7.2  ALBUMIN 3.0*   No results for input(s): LIPASE, AMYLASE in the last 168 hours. No results for input(s): AMMONIA in the last 168 hours. CBC: Recent Labs  Lab 09/28/19 2006 09/28/19 2006 09/28/19 2011 09/29/19 0252 09/30/19 0302 10/01/19 0126 10/02/19 0317  WBC 12.7*  --   --  7.0 8.2 8.5 7.9  NEUTROABS 10.0*  --   --  5.7 6.1 6.7 6.0  HGB 13.6   < > 14.6  15.0 12.5* 10.5* 12.1* 9.5*  HCT 43.4   < > 43.0  44.0 40.4 34.2* 38.1* 30.7*  MCV 88.6  --   --  90.6 88.6 88.4 89.0  PLT 405*  --   --  289 254 299 249   < > = values in this interval not displayed.   Cardiac Enzymes: No results for input(s): CKTOTAL, CKMB, CKMBINDEX, TROPONINI in the last 168 hours. BNP: Invalid input(s): POCBNP CBG: No results for input(s): GLUCAP in the last 168 hours. D-Dimer No results for input(s): DDIMER in the last 72 hours. Hgb A1c No results for input(s): HGBA1C in the last 72 hours. Lipid Profile No results for input(s): CHOL, HDL, LDLCALC, TRIG, CHOLHDL, LDLDIRECT in the last 72 hours. Thyroid function studies No results for input(s): TSH, T4TOTAL, T3FREE, THYROIDAB in the last 72 hours.  Invalid input(s): FREET3 Anemia work up No results for input(s): VITAMINB12, FOLATE, FERRITIN, TIBC, IRON, RETICCTPCT in the last 72 hours. Urinalysis  Component Value Date/Time   COLORURINE YELLOW 09/29/2019 0419   APPEARANCEUR HAZY (A) 09/29/2019 0419   LABSPEC 1.017 09/29/2019 0419   PHURINE  5.0 09/29/2019 0419   GLUCOSEU 150 (A) 09/29/2019 0419   HGBUR MODERATE (A) 09/29/2019 0419   BILIRUBINUR NEGATIVE 09/29/2019 0419   KETONESUR 20 (A) 09/29/2019 0419   PROTEINUR NEGATIVE 09/29/2019 0419   UROBILINOGEN 0.2 06/22/2014 1412   NITRITE NEGATIVE 09/29/2019 0419   LEUKOCYTESUR NEGATIVE 09/29/2019 0419   Sepsis Labs Invalid input(s): PROCALCITONIN,  WBC,  LACTICIDVEN Microbiology Recent Results (from the past 240 hour(s))  SARS Coronavirus 2 by RT PCR (hospital order, performed in [facility] hospital lab) Nasopharyngeal Nasopharyngeal Swab     Status: None   Collection Time: 09/28/19  8:03 PM   Specimen: Nasopharyngeal Swab  Result Value Ref Range Status   SARS Coronavirus 2 NEGATIVE NEGATIVE Final    Comment: (NOTE) SARS-CoV-2 target nucleic acids are NOT DETECTED.  The SARS-CoV-2 RNA is generally detectable in upper and lower respiratory specimens during the acute phase of infection. The lowest concentration of SARS-CoV-2 viral copies this assay can detect is 250 copies / mL. A negative result does not preclude SARS-CoV-2 infection and should not be used as the sole basis for treatment or other patient management decisions.  A negative result may occur with improper specimen collection / handling, submission of specimen other than nasopharyngeal swab, presence of viral mutation(s) within the areas targeted by this assay, and inadequate number of viral copies (<250 copies / mL). A negative result must be combined with clinical observations, patient history, and epidemiological information.  Fact Sheet for Patients:   BoilerBrush.com.cy  Fact Sheet for Healthcare Providers: https://pope.com/  This test is not yet approved or  cleared by the [place] FDA and has been authorized for detection and/or diagnosis of SARS-CoV-2 by FDA under an Emergency Use Authorization (EUA).  This EUA will remain in effect (meaning  this test can be used) for the duration of the COVID-19 declaration under Section 564(b)(1) of the Act, 21 U.S.C. section 360bbb-3(b)(1), unless the authorization is terminated or revoked sooner.  Performed at [facility], [address], [place], [place] [address]   Blood Culture (routine x 2)     Status: None (Preliminary result)   Collection Time: 09/28/19  8:07 PM   Specimen: BLOOD  Result Value Ref Range Status   Specimen Description BLOOD RIGHT ANTECUBITAL  Final   Special Requests   Final    BOTTLES DRAWN AEROBIC AND ANAEROBIC Blood Culture results may not be optimal due to an excessive volume of blood received in culture bottles   Culture  Setup Time   Final    GRAM POSITIVE COCCI IN CLUSTERS AEROBIC BOTTLE ONLY Organism ID to follow CRITICAL RESULT CALLED TO, READ BACK BY AND VERIFIED WITH: Lieutenant Diego [id number] 10/02/2019 Girtha Hake Performed at [facility], [address] [place], [place] [address]    Culture GRAM POSITIVE COCCI  Final   Report Status PENDING  Incomplete  Blood Culture (routine x 2)     Status: None (Preliminary result)   Collection Time: 09/28/19  8:07 PM   Specimen: BLOOD LEFT ARM  Result Value Ref Range Status   Specimen Description BLOOD LEFT ARM  Final   Special Requests   Final    BOTTLES DRAWN AEROBIC ONLY Blood Culture results may not be optimal due to an inadequate volume of blood received in culture bottles   Culture  Final    NO GROWTH 3 DAYS Performed at [facility], [address], [place], [place] [address]    Report Status PENDING  Incomplete  Blood Culture ID Panel (Reflexed)     Status: Abnormal   Collection Time: 09/28/19  8:07 PM  Result Value Ref Range Status   Enterococcus faecalis NOT DETECTED NOT DETECTED Final   Enterococcus Faecium NOT DETECTED NOT DETECTED Final   Listeria monocytogenes NOT DETECTED NOT DETECTED Final   Staphylococcus species DETECTED (A) NOT DETECTED Final    Comment: CRITICAL  RESULT CALLED TO, READ BACK BY AND VERIFIED WITH: G. ABBOTT,PHARMD 0550 10/02/2019 T. TYSOR    Staphylococcus aureus (BCID) NOT DETECTED NOT DETECTED Final   Staphylococcus epidermidis NOT DETECTED NOT DETECTED Final   Staphylococcus lugdunensis NOT DETECTED NOT DETECTED Final   Streptococcus species NOT DETECTED NOT DETECTED Final   Streptococcus agalactiae NOT DETECTED NOT DETECTED Final   Streptococcus pneumoniae NOT DETECTED NOT DETECTED Final   Streptococcus pyogenes NOT DETECTED NOT DETECTED Final   A.calcoaceticus-baumannii NOT DETECTED NOT DETECTED Final   Bacteroides fragilis NOT DETECTED NOT DETECTED Final   Enterobacterales NOT DETECTED NOT DETECTED Final   Enterobacter cloacae complex NOT DETECTED NOT DETECTED Final   Escherichia coli NOT DETECTED NOT DETECTED Final   Klebsiella aerogenes NOT DETECTED NOT DETECTED Final   Klebsiella oxytoca NOT DETECTED NOT DETECTED Final   Klebsiella pneumoniae NOT DETECTED NOT DETECTED Final   Proteus species NOT DETECTED NOT DETECTED Final   Salmonella species NOT DETECTED NOT DETECTED Final   Serratia marcescens NOT DETECTED NOT DETECTED Final   Haemophilus influenzae NOT DETECTED NOT DETECTED Final   Neisseria meningitidis NOT DETECTED NOT DETECTED Final   Pseudomonas aeruginosa NOT DETECTED NOT DETECTED Final   Stenotrophomonas maltophilia NOT DETECTED NOT DETECTED Final   Candida albicans NOT DETECTED NOT DETECTED Final   Candida auris NOT DETECTED NOT DETECTED Final   Candida glabrata NOT DETECTED NOT DETECTED Final   Candida krusei NOT DETECTED NOT DETECTED Final   Candida parapsilosis NOT DETECTED NOT DETECTED Final   Candida tropicalis NOT DETECTED NOT DETECTED Final   Cryptococcus neoformans/gattii NOT DETECTED NOT DETECTED Final    Comment: Performed at [facility], [address] [place], [place] [address]  Urine culture     Status: Abnormal   Collection Time: 09/29/19  4:19 AM   Specimen: In/Out Cath Urine   Result Value Ref Range Status   Specimen Description IN/OUT CATH URINE  Final   Special Requests   Final    NONE Performed at [facility], [address] [place], [place] [address]    Culture MULTIPLE SPECIES PRESENT, SUGGEST RECOLLECTION (A)  Final   Report Status 09/30/2019 FINAL  Final  MRSA PCR Screening     Status: None   Collection Time: 09/29/19  5:48 AM   Specimen: Nasal Mucosa; Nasopharyngeal  Result Value Ref Range Status   MRSA by PCR NEGATIVE NEGATIVE Final    Comment:        The GeneXpert MRSA Assay (FDA approved for NASAL specimens only), is one component of a comprehensive MRSA colonization surveillance program. It is not intended to diagnose MRSA infection nor to guide or monitor treatment for MRSA infections. Performed at [facility], [address], [place], [place] [address]      Time coordinating discharge: 40 minutes  SIGNED:   Alba Cory, MD  [facility]

## 2019-10-02 NOTE — Progress Notes (Signed)
PMT provider will meet with daughter, Almyra Free this afternoon around 2pm to complete MOST form and further GOC discussion regarding decision for palliative services or hospice services at [facility] on discharge. Will also get POA, Bryley on speaker phone for this conversation. Ajani is not local and unable to be present.  NO CHARGE  Vennie Homans, DNP, FNP-C Palliative Medicine Team  Phone: [phone] Fax: [phone]

## 2019-10-02 NOTE — Progress Notes (Signed)
PHARMACY - PHYSICIAN COMMUNICATION CRITICAL VALUE ALERT - BLOOD CULTURE IDENTIFICATION (BCID)  Willie Turner is an 79 y.o. male who presented to [facility] on 09/28/2019 with a chief complaint of sepsis/PNA  Assessment:   1/2 blood cultures from 9/9 growing Staph species, likely contaminant  Name of physician (or Provider) Contacted:  Dr. Leafy Half  Current antibiotics:  Cefepime and Doxycycline  Changes to prescribed antibiotics recommended:  None at this time  Results for orders placed or performed during the hospital encounter of 09/28/19  Blood Culture ID Panel (Reflexed) (Collected: 09/28/2019  8:07 PM)  Result Value Ref Range   Enterococcus faecalis NOT DETECTED NOT DETECTED   Enterococcus Faecium NOT DETECTED NOT DETECTED   Listeria monocytogenes NOT DETECTED NOT DETECTED   Staphylococcus species DETECTED (A) NOT DETECTED   Staphylococcus aureus (BCID) NOT DETECTED NOT DETECTED   Staphylococcus epidermidis NOT DETECTED NOT DETECTED   Staphylococcus lugdunensis NOT DETECTED NOT DETECTED   Streptococcus species NOT DETECTED NOT DETECTED   Streptococcus agalactiae NOT DETECTED NOT DETECTED   Streptococcus pneumoniae NOT DETECTED NOT DETECTED   Streptococcus pyogenes NOT DETECTED NOT DETECTED   A.calcoaceticus-baumannii NOT DETECTED NOT DETECTED   Bacteroides fragilis NOT DETECTED NOT DETECTED   Enterobacterales NOT DETECTED NOT DETECTED   Enterobacter cloacae complex NOT DETECTED NOT DETECTED   Escherichia coli NOT DETECTED NOT DETECTED   Klebsiella aerogenes NOT DETECTED NOT DETECTED   Klebsiella oxytoca NOT DETECTED NOT DETECTED   Klebsiella pneumoniae NOT DETECTED NOT DETECTED   Proteus species NOT DETECTED NOT DETECTED   Salmonella species NOT DETECTED NOT DETECTED   Serratia marcescens NOT DETECTED NOT DETECTED   Haemophilus influenzae NOT DETECTED NOT DETECTED   Neisseria meningitidis NOT DETECTED NOT DETECTED   Pseudomonas aeruginosa NOT DETECTED NOT DETECTED    Stenotrophomonas maltophilia NOT DETECTED NOT DETECTED   Candida albicans NOT DETECTED NOT DETECTED   Candida auris NOT DETECTED NOT DETECTED   Candida glabrata NOT DETECTED NOT DETECTED   Candida krusei NOT DETECTED NOT DETECTED   Candida parapsilosis NOT DETECTED NOT DETECTED   Candida tropicalis NOT DETECTED NOT DETECTED   Cryptococcus neoformans/gattii NOT DETECTED NOT DETECTED    Eddie Candle 10/02/2019  6:29 AM

## 2019-10-02 NOTE — [place] FL2 (Signed)
[place] MEDICAID FL2 LEVEL OF CARE SCREENING TOOL     IDENTIFICATION  Patient Name: Willie Turner Birthdate: January 13, 1941 Sex: male Admission Date (Current Location): 09/28/2019  [facility] and [place] Number:  Producer, television/film/video and Address:  [facility], [address] [place], [place] [address]      Provider Number: [id number]  Attending Physician Name and Address:  Alba Cory, MD  Relative Name and Phone Number:       Current Level of Care: Hospital Recommended Level of Care: Skilled Nursing Facility Prior Approval Number:    Date Approved/Denied:   PASRR Number:    Discharge Plan: SNF    Current Diagnoses: Patient Active Problem List   Diagnosis Date Noted  . Community acquired pneumonia of left lower lobe of lung   . Palliative care by specialist   . Goals of care, counseling/discussion   . Sepsis due to pneumonia (HCC) 09/28/2019  . Acute respiratory failure with hypoxia (HCC) 09/28/2019  . Prolonged QT interval 09/28/2019  . OA (osteoarthritis) of knee 06/27/2014  . Abnormal MRI of head 06/19/2014  . 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 Psychomotor agitation 05/22/2014  . Dementia with behavioral disturbance (HCC) 05/22/2014  . Alcohol dependence (HCC) 09/06/2012  . COPD (chronic obstructive pulmonary disease) (HCC)   . Shortness of breath   . Arthritis   . Hypertension   . Asthma   . Bronchitis   . Right knee DJD     Orientation RESPIRATION BLADDER Height & Weight     Self, Time, Situation, Place  O2 ([place] 2L) Incontinent Weight: 172 lb 13.5 oz (78.4 kg) Height:  5\' 10"  (177.8 cm)  BEHAVIORAL SYMPTOMS/MOOD NEUROLOGICAL BOWEL NUTRITION STATUS      Incontinent Diet (see DC summary)  AMBULATORY STATUS COMMUNICATION OF NEEDS Skin   Extensive Assist Non-Verbally Normal                       Personal Care Assistance Level of Assistance  Bathing, Feeding, Dressing Bathing  Assistance: Maximum assistance Feeding assistance: Maximum assistance Dressing Assistance: Maximum assistance     Functional Limitations Info  Speech     Speech Info: Impaired (nonverbal)    SPECIAL CARE FACTORS FREQUENCY                       Contractures Contractures Info: Not present    Additional Factors Info  Code Status, Allergies, Psychotropic Code Status Info: DNR Allergies Info: NKA Psychotropic Info: Depakote sprinkle 250mg  every 12 hours         Current Medications (10/02/2019):  This is the current hospital active medication list Current Facility-Administered Medications  Medication Dose Route Frequency Provider Last Rate Last Admin  . 0.9 %  sodium chloride infusion   Intravenous Continuous Regalado, Belkys A, MD 75 mL/hr at 10/02/19 0353 New Bag at 10/02/19 0353  . albuterol (PROVENTIL) (2.5 MG/3ML) 0.083% nebulizer solution 2.5 mg  2.5 mg Nebulization Q4H PRN Opyd, 10/04/19, MD      . ceFEPIme (MAXIPIME) 2 g in sodium chloride 0.9 % 100 mL IVPB  2 g Intravenous Q8H 10/04/19, RPH 200 mL/hr at 10/02/19 0848 2 g at 10/02/19 0848  . divalproex (DEPAKOTE SPRINKLE) capsule 250 mg  250 mg Oral Q12H Regalado, Belkys A, MD   250 mg at 10/02/19 1125  . doxycycline (VIBRAMYCIN) 100 mg in sodium chloride 0.9 % 250  mL IVPB  100 mg Intravenous Q12H Briscoe Deutscher, MD 125 mL/hr at 10/02/19 1117 100 mg at 10/02/19 1117  . enoxaparin (LOVENOX) injection 40 mg  40 mg Subcutaneous Q24H Opyd, Lavone Neri, MD   40 mg at 10/01/19 2202  . ipratropium-albuterol (DUONEB) 0.5-2.5 (3) MG/3ML nebulizer solution 3 mL  3 mL Nebulization TID Regalado, Belkys A, MD   3 mL at 10/02/19 1433     Discharge Medications: Please see discharge summary for a list of discharge medications.  Relevant Imaging Results:  Relevant Lab Results:   Additional Information SS#: [id number]; Hospice to be setup at [facility], LCSW

## 2019-10-03 LAB — CULTURE, BLOOD (ROUTINE X 2): Culture: NO GROWTH

## 2019-10-04 LAB — CULTURE, BLOOD (ROUTINE X 2)

## 2019-10-07 LAB — CULTURE, BLOOD (ROUTINE X 2)
Culture: NO GROWTH
Culture: NO GROWTH

## 2019-12-20 DEATH — deceased
